# Patient Record
Sex: Female | Born: 1970 | Race: White | Hispanic: No | Marital: Married | Smoking: Never smoker
Health system: Southern US, Community
[De-identification: ages and names within clinical notes are randomized; demographics above are authoritative.]

## PROBLEM LIST (undated history)

## (undated) DIAGNOSIS — I340 Nonrheumatic mitral (valve) insufficiency: Secondary | ICD-10-CM

## (undated) DIAGNOSIS — D649 Anemia, unspecified: Secondary | ICD-10-CM

## (undated) DIAGNOSIS — T8859XA Other complications of anesthesia, initial encounter: Secondary | ICD-10-CM

## (undated) DIAGNOSIS — E739 Lactose intolerance, unspecified: Secondary | ICD-10-CM

## (undated) DIAGNOSIS — R002 Palpitations: Secondary | ICD-10-CM

## (undated) DIAGNOSIS — E559 Vitamin D deficiency, unspecified: Secondary | ICD-10-CM

## (undated) DIAGNOSIS — I6529 Occlusion and stenosis of unspecified carotid artery: Secondary | ICD-10-CM

## (undated) DIAGNOSIS — F419 Anxiety disorder, unspecified: Secondary | ICD-10-CM

## (undated) DIAGNOSIS — K829 Disease of gallbladder, unspecified: Secondary | ICD-10-CM

## (undated) DIAGNOSIS — E7800 Pure hypercholesterolemia, unspecified: Secondary | ICD-10-CM

## (undated) DIAGNOSIS — K589 Irritable bowel syndrome without diarrhea: Secondary | ICD-10-CM

## (undated) DIAGNOSIS — I493 Ventricular premature depolarization: Secondary | ICD-10-CM

## (undated) DIAGNOSIS — F988 Other specified behavioral and emotional disorders with onset usually occurring in childhood and adolescence: Secondary | ICD-10-CM

## (undated) DIAGNOSIS — M549 Dorsalgia, unspecified: Secondary | ICD-10-CM

## (undated) DIAGNOSIS — F32A Depression, unspecified: Secondary | ICD-10-CM

## (undated) DIAGNOSIS — M1990 Unspecified osteoarthritis, unspecified site: Secondary | ICD-10-CM

## (undated) DIAGNOSIS — N390 Urinary tract infection, site not specified: Secondary | ICD-10-CM

## (undated) DIAGNOSIS — I10 Essential (primary) hypertension: Secondary | ICD-10-CM

## (undated) DIAGNOSIS — R12 Heartburn: Secondary | ICD-10-CM

## (undated) HISTORY — DX: Occlusion and stenosis of unspecified carotid artery: I65.29

## (undated) HISTORY — DX: Lactose intolerance, unspecified: E73.9

## (undated) HISTORY — DX: Depression, unspecified: F32.A

## (undated) HISTORY — PX: COLONOSCOPY WITH ESOPHAGOGASTRODUODENOSCOPY (EGD): SHX5779

## (undated) HISTORY — DX: Other specified behavioral and emotional disorders with onset usually occurring in childhood and adolescence: F98.8

## (undated) HISTORY — DX: Heartburn: R12

## (undated) HISTORY — DX: Disease of gallbladder, unspecified: K82.9

## (undated) HISTORY — DX: Vitamin D deficiency, unspecified: E55.9

## (undated) HISTORY — DX: Dorsalgia, unspecified: M54.9

## (undated) HISTORY — DX: Irritable bowel syndrome, unspecified: K58.9

## (undated) HISTORY — DX: Urinary tract infection, site not specified: N39.0

## (undated) HISTORY — DX: Anxiety disorder, unspecified: F41.9

---

## 2004-04-04 ENCOUNTER — Ambulatory Visit: Payer: Self-pay

## 2005-02-01 ENCOUNTER — Ambulatory Visit: Payer: Self-pay

## 2006-04-11 ENCOUNTER — Ambulatory Visit (HOSPITAL_BASED_OUTPATIENT_CLINIC_OR_DEPARTMENT_OTHER): Admission: RE | Admit: 2006-04-11 | Discharge: 2006-04-11 | Payer: Self-pay | Admitting: Orthopaedic Surgery

## 2007-11-06 ENCOUNTER — Ambulatory Visit: Payer: Self-pay | Admitting: Internal Medicine

## 2007-11-27 ENCOUNTER — Ambulatory Visit: Payer: Self-pay | Admitting: Cardiology

## 2008-07-01 ENCOUNTER — Ambulatory Visit: Payer: Self-pay

## 2009-05-13 HISTORY — PX: CHOLECYSTECTOMY: SHX55

## 2009-06-06 ENCOUNTER — Ambulatory Visit: Payer: Self-pay | Admitting: Internal Medicine

## 2009-08-17 ENCOUNTER — Ambulatory Visit: Payer: Self-pay | Admitting: Surgery

## 2009-08-24 ENCOUNTER — Ambulatory Visit: Payer: Self-pay | Admitting: Surgery

## 2009-11-19 ENCOUNTER — Ambulatory Visit: Payer: Self-pay | Admitting: Unknown Physician Specialty

## 2010-09-14 ENCOUNTER — Ambulatory Visit: Payer: Self-pay

## 2010-11-02 ENCOUNTER — Ambulatory Visit: Payer: Self-pay | Admitting: Gastroenterology

## 2010-11-06 LAB — PATHOLOGY REPORT

## 2011-06-14 ENCOUNTER — Ambulatory Visit: Payer: Self-pay | Admitting: Gastroenterology

## 2011-09-13 ENCOUNTER — Ambulatory Visit: Payer: Self-pay | Admitting: Orthopedic Surgery

## 2011-12-09 ENCOUNTER — Ambulatory Visit: Payer: Self-pay | Admitting: Orthopedic Surgery

## 2012-09-23 ENCOUNTER — Other Ambulatory Visit: Payer: Self-pay | Admitting: Gastroenterology

## 2012-10-25 ENCOUNTER — Emergency Department: Payer: Self-pay | Admitting: Emergency Medicine

## 2012-10-25 LAB — COMPREHENSIVE METABOLIC PANEL
Albumin: 3.7 g/dL (ref 3.4–5.0)
Anion Gap: 4 — ABNORMAL LOW (ref 7–16)
BUN: 12 mg/dL (ref 7–18)
Bilirubin,Total: 0.3 mg/dL (ref 0.2–1.0)
Creatinine: 0.87 mg/dL (ref 0.60–1.30)
EGFR (African American): >60
Potassium: 4 mmol/L (ref 3.5–5.1)
Sodium: 140 mmol/L (ref 136–145)
Total Protein: 7.5 g/dL (ref 6.4–8.2)

## 2012-10-25 LAB — CK TOTAL AND CKMB (NOT AT ARMC): CK-MB: <0.5 ng/mL — ABNORMAL LOW (ref 0.5–3.6)

## 2012-10-25 LAB — CBC
HGB: 13.7 g/dL (ref 12.0–16.0)
MCH: 31.9 pg (ref 26.0–34.0)
Platelet: 294 10*3/uL (ref 150–440)
RBC: 4.3 10*6/uL (ref 3.80–5.20)
RDW: 12.9 % (ref 11.5–14.5)

## 2012-10-25 LAB — TROPONIN I: Troponin-I: <0.02 ng/mL

## 2012-10-25 LAB — PRO B NATRIURETIC PEPTIDE: B-Type Natriuretic Peptide: 90 pg/mL (ref 0–125)

## 2012-10-26 LAB — DRUG SCREEN, URINE
Barbiturates, Ur Screen: NEGATIVE (ref ?–200)
Cannabinoid 50 Ng, Ur ~~LOC~~: NEGATIVE (ref ?–50)
Cocaine Metabolite,Ur ~~LOC~~: NEGATIVE (ref ?–300)
MDMA (Ecstasy)Ur Screen: NEGATIVE (ref ?–500)

## 2012-10-26 LAB — TROPONIN I: Troponin-I: <0.02 ng/mL

## 2012-11-19 ENCOUNTER — Ambulatory Visit: Payer: Self-pay | Admitting: Internal Medicine

## 2012-12-15 ENCOUNTER — Emergency Department: Payer: Self-pay | Admitting: Emergency Medicine

## 2012-12-15 LAB — BASIC METABOLIC PANEL
Anion Gap: 7 (ref 7–16)
BUN: 11 mg/dL (ref 7–18)
Calcium, Total: 8.6 mg/dL (ref 8.5–10.1)
Co2: 27 mmol/L (ref 21–32)
Creatinine: 0.72 mg/dL (ref 0.60–1.30)
EGFR (African American): >60
Glucose: 96 mg/dL (ref 65–99)
Potassium: 3.5 mmol/L (ref 3.5–5.1)

## 2012-12-15 LAB — LIPASE, BLOOD: Lipase: 139 U/L (ref 73–393)

## 2012-12-15 LAB — CBC
HGB: 13.6 g/dL (ref 12.0–16.0)
MCHC: 35.2 g/dL (ref 32.0–36.0)
Platelet: 274 10*3/uL (ref 150–440)
RBC: 4.15 10*6/uL (ref 3.80–5.20)

## 2012-12-15 LAB — CK: CK, Total: 83 U/L (ref 21–215)

## 2012-12-18 ENCOUNTER — Ambulatory Visit: Payer: Self-pay | Admitting: Gastroenterology

## 2013-02-05 ENCOUNTER — Ambulatory Visit: Payer: Self-pay | Admitting: Gastroenterology

## 2013-02-09 LAB — PATHOLOGY REPORT

## 2013-02-25 ENCOUNTER — Ambulatory Visit: Payer: Self-pay

## 2013-03-10 ENCOUNTER — Ambulatory Visit: Payer: Self-pay | Admitting: Internal Medicine

## 2013-04-16 ENCOUNTER — Ambulatory Visit: Payer: Self-pay | Admitting: General Practice

## 2013-04-23 ENCOUNTER — Ambulatory Visit: Payer: Self-pay | Admitting: Orthopedic Surgery

## 2013-05-13 ENCOUNTER — Ambulatory Visit: Payer: Self-pay | Admitting: General Practice

## 2013-10-11 HISTORY — PX: LAPAROSCOPIC GASTRIC SLEEVE RESECTION WITH HIATAL HERNIA REPAIR: SHX6512

## 2013-10-28 ENCOUNTER — Ambulatory Visit: Payer: Self-pay | Admitting: Bariatrics

## 2013-11-09 ENCOUNTER — Inpatient Hospital Stay: Payer: Self-pay | Admitting: Bariatrics

## 2013-11-09 LAB — CREATININE, SERUM
Creatinine: 0.82 mg/dL (ref 0.60–1.30)
EGFR (African American): >60
EGFR (Non-African Amer.): >60

## 2013-11-10 LAB — BASIC METABOLIC PANEL
ANION GAP: 5 — AB (ref 7–16)
BUN: 4 mg/dL — AB (ref 7–18)
CHLORIDE: 103 mmol/L (ref 98–107)
CREATININE: 0.77 mg/dL (ref 0.60–1.30)
Calcium, Total: 8.8 mg/dL (ref 8.5–10.1)
Co2: 29 mmol/L (ref 21–32)
GLUCOSE: 150 mg/dL — AB (ref 65–99)
OSMOLALITY: 274 (ref 275–301)
Potassium: 4.3 mmol/L (ref 3.5–5.1)
SODIUM: 137 mmol/L (ref 136–145)

## 2013-11-10 LAB — CBC WITH DIFFERENTIAL/PLATELET
Basophil #: 0 10*3/uL (ref 0.0–0.1)
Basophil %: 0.1 %
EOS ABS: 0 10*3/uL (ref 0.0–0.7)
Eosinophil %: 0 %
HCT: 38.4 % (ref 35.0–47.0)
HGB: 12.7 g/dL (ref 12.0–16.0)
LYMPHS PCT: 10.2 %
Lymphocyte #: 1.2 10*3/uL (ref 1.0–3.6)
MCH: 30.1 pg (ref 26.0–34.0)
MCHC: 33 g/dL (ref 32.0–36.0)
MCV: 91 fL (ref 80–100)
MONOS PCT: 5.6 %
Monocyte #: 0.7 x10 3/mm (ref 0.2–0.9)
NEUTROS ABS: 10.1 10*3/uL — AB (ref 1.4–6.5)
NEUTROS PCT: 84.1 %
PLATELETS: 362 10*3/uL (ref 150–440)
RBC: 4.21 10*6/uL (ref 3.80–5.20)
RDW: 13.7 % (ref 11.5–14.5)
WBC: 12 10*3/uL — AB (ref 3.6–11.0)

## 2013-11-10 LAB — PHOSPHORUS: Phosphorus: 3.3 mg/dL (ref 2.5–4.9)

## 2013-11-10 LAB — MAGNESIUM: Magnesium: 2.3 mg/dL

## 2013-11-10 LAB — ALBUMIN: Albumin: 3.8 g/dL (ref 3.4–5.0)

## 2013-11-11 LAB — PATHOLOGY REPORT

## 2013-11-23 ENCOUNTER — Ambulatory Visit: Payer: Self-pay | Admitting: Bariatrics

## 2013-12-11 ENCOUNTER — Ambulatory Visit: Payer: Self-pay | Admitting: Bariatrics

## 2014-09-03 NOTE — Op Note (Signed)
PATIENT NAME:  Cheryl Murillo, MCKILLOP MR#:  [id number] DATE OF BIRTH:  08-01-1970  DATE OF PROCEDURE:  11/09/2013  PROCEDURES PERFORMED: Laparoscopic sleeve gastrectomy with repair of hiatal hernia.   SURGEON:  Tyrone Apple. Alva Garnet, M.D.   ASSISTANT:  Anabel Halon, PA.   PREOPERATIVE DIAGNOSIS: Long-standing obesity associated with comorbidities of hypertension and prediabetes.    SECONDARY DIAGOSIS:  Chronic gastroesophageal reflux disease.  POSTOPERATIVE DIAGNOSIS:  Long-standing obesity associated with comorbidities of hypertension and prediabetes.  Chronic gastroesophageal reflux disease. Presence of a small hiatal hernia, not defined by preoperative upper GI series.   PROCEDURE NOTE: The patient was brought to the operating room and placed in the supine position. General anesthesia was obtained with orotracheal intubation. TED hose and Thromboguards were applied, a foot board was applied at the end of the operative bed. The lower chest and abdomen were then sterilely prepped and draped.   The patient had a 5-mm Optiview trocar introduced under direct visualization in the left upper quadrant of the abdomen. Pneumoperitoneum was obtained with carbon dioxide. The patient then had 3 additional trocars introduced across the upper abdomen and a Nathanson liver retractor introduced through the subxiphoid defect. Upon elevation of the left lobe of the liver the  patient was noted to have a moderately significant indentation in the region of the hiatus. Fat pad was released from the anterior stomach and undersurface of the left hemidiaphragm. It was then followed by release of phrenoesophageal ligaments and attachments of the upper fundus from the undersurface of the left hemidiaphragm. In so doing the GE junction was noted to be lying just within the hiatus suggestive of a small sliding hiatal hernia.   It was decided to proceed with repair of this given the patient's history of reflux disease managed by  way of PPI therapy and increased potential for reflux following creation of the sleeve effect. The patient had division of the gastrohepatic ligament, followed by division of the peritoneum across the anterior hiatus. This was then followed by division of the peritoneum along the right crural margin. Blunt dissection was used to reduce herniated lesser sac fatty tissue and expose the underlying posterior vagal nerve and separate it from the underlying aorta.   The patient then had circumferential dissection of the lower esophagus within the lower mediastinum, freeing the esophagus and associated anterior and posterior vagal nerves away from the aorta, the pleural surfaces and the anterior aspect from the pericardium. Circumferential dissection of the esophagus extended into the lower mediastinum over a distance of approximately 6 cm. This resulted in delivery of 1.5 cm of the esophagus lying comfortably in the abdominal cavity.   At this point 2 interrupted 0-Surgidac sutures were used to approximate the crural musculature posteriorly. This was then followed by identification of the pylorus and beginning approximately 4 cm proximal to this there was division of arcade vessels along the greater curvature of the stomach. This was extended superiorly over a distance of approximately 8 cm. Posterior attachments of the stomach to the pancreas then taken down by use of the Harmonic scalpel.   The patient then had initiation of creation of a medially-based gastric tube effect. The use of GI staplers was done sequentially, the first being a green load of staples placed in a relative transverse direction avoiding narrowing in the region of the incisura. A second gold load of staple was also placed in a transverse direction. This was done with use of Seamguard staple line buttress system. A 36 [place]  ViSiGi device was then directed into the area of the antrum and used as a guide for creation of a more vertical line of  staples along the lesser curvature of the stomach. After these firings were done the ViSiGi device was gradually withdrawn to try to effect a smooth, tubular effect with consistency of the working lumen.   Eventually the staple line was brought out just lateral to the angle of His. The lateral stomach was then freed from the residual attachments to the gastrocolic and gastrosplenic ligaments. The ViSiGi device was then used for insufflation of the stomach which was sealed distally and a water bath was performed.  No air leak was identified. The ViSiGi device was withdrawn at this time.   The dog-ear of stomach in the region of the angle of His was then fixed to the left crural musculature with an additional suture placed inferior to this. This was done in an effort to fixate the stomach in the abdominal cavity and prevent remigration to the lower mediastinum. The lateral stomach was then retrieved through the 12-mm trocar site in the right upper abdomen. The fascia and peritoneum of defect was closed with 0-Vicryl suture passed by a needle system under direct visualization. The pneumoperitoneum was relieved. The trocar wounds were treated with 0.25% Marcaine with epinephrine and closed with 4-0 Monocryl in the dermis followed by Dermabond. The patient was allowed to recover having tolerated the procedure well.    ____________________________ Tyrone AppleMichael A. Alva Garnetyner, MD mat:lt D: 11/09/2013 10:47:13 ET T: 11/09/2013 11:01:06 ET JOB#: [id number]  cc: Casimiro NeedleMichael A. Alva Garnetyner, MD, <Dictator> Everette RankMICHAEL A TYNER MD ELECTRONICALLY SIGNED 11/10/2013 7:59

## 2014-11-01 ENCOUNTER — Other Ambulatory Visit: Payer: Self-pay | Admitting: Orthopedic Surgery

## 2014-11-01 DIAGNOSIS — M5412 Radiculopathy, cervical region: Secondary | ICD-10-CM

## 2014-11-03 ENCOUNTER — Ambulatory Visit
Admission: RE | Admit: 2014-11-03 | Discharge: 2014-11-03 | Disposition: A | Discharge status: Home / Self Care | Payer: BLUE CROSS/BLUE SHIELD | Source: Ambulatory Visit | Attending: Orthopedic Surgery | Admitting: Orthopedic Surgery

## 2014-11-03 DIAGNOSIS — M5382 Other specified dorsopathies, cervical region: Secondary | ICD-10-CM | POA: Diagnosis not present

## 2014-11-03 DIAGNOSIS — M4802 Spinal stenosis, cervical region: Secondary | ICD-10-CM | POA: Diagnosis not present

## 2014-11-03 DIAGNOSIS — M5412 Radiculopathy, cervical region: Secondary | ICD-10-CM

## 2014-11-03 DIAGNOSIS — M542 Cervicalgia: Secondary | ICD-10-CM | POA: Diagnosis present

## 2014-11-15 ENCOUNTER — Other Ambulatory Visit: Payer: Self-pay | Admitting: Bariatrics

## 2014-11-15 DIAGNOSIS — K219 Gastro-esophageal reflux disease without esophagitis: Secondary | ICD-10-CM

## 2014-11-24 ENCOUNTER — Ambulatory Visit
Admission: RE | Admit: 2014-11-24 | Discharge: 2014-11-24 | Disposition: A | Discharge status: Home / Self Care | Payer: BLUE CROSS/BLUE SHIELD | Source: Ambulatory Visit | Attending: Bariatrics | Admitting: Bariatrics

## 2014-11-24 DIAGNOSIS — K219 Gastro-esophageal reflux disease without esophagitis: Secondary | ICD-10-CM | POA: Insufficient documentation

## 2014-11-24 DIAGNOSIS — K2970 Gastritis, unspecified, without bleeding: Secondary | ICD-10-CM | POA: Insufficient documentation

## 2015-10-26 ENCOUNTER — Other Ambulatory Visit: Payer: Self-pay | Admitting: Internal Medicine

## 2015-10-26 DIAGNOSIS — Z1231 Encounter for screening mammogram for malignant neoplasm of breast: Secondary | ICD-10-CM

## 2015-11-16 ENCOUNTER — Ambulatory Visit: Payer: BLUE CROSS/BLUE SHIELD | Attending: Internal Medicine

## 2015-12-14 ENCOUNTER — Ambulatory Visit
Admission: RE | Admit: 2015-12-14 | Discharge: 2015-12-14 | Disposition: A | Discharge status: Home / Self Care | Payer: Managed Care, Other (non HMO) | Source: Ambulatory Visit | Attending: Internal Medicine | Admitting: Internal Medicine

## 2015-12-14 DIAGNOSIS — Z1231 Encounter for screening mammogram for malignant neoplasm of breast: Secondary | ICD-10-CM | POA: Insufficient documentation

## 2015-12-15 ENCOUNTER — Other Ambulatory Visit: Payer: Self-pay | Admitting: Internal Medicine

## 2015-12-15 DIAGNOSIS — N6310 Unspecified lump in the right breast, unspecified quadrant: Secondary | ICD-10-CM

## 2016-01-24 ENCOUNTER — Ambulatory Visit
Admission: RE | Admit: 2016-01-24 | Discharge: 2016-01-24 | Disposition: A | Discharge status: Home / Self Care | Payer: Managed Care, Other (non HMO) | Source: Ambulatory Visit | Attending: Internal Medicine | Admitting: Internal Medicine

## 2016-01-24 DIAGNOSIS — N63 Unspecified lump in breast: Secondary | ICD-10-CM | POA: Diagnosis present

## 2016-01-24 DIAGNOSIS — N6310 Unspecified lump in the right breast, unspecified quadrant: Secondary | ICD-10-CM

## 2016-01-29 ENCOUNTER — Other Ambulatory Visit: Payer: BLUE CROSS/BLUE SHIELD

## 2016-01-29 ENCOUNTER — Ambulatory Visit: Payer: BLUE CROSS/BLUE SHIELD

## 2016-03-28 ENCOUNTER — Other Ambulatory Visit (INDEPENDENT_AMBULATORY_CARE_PROVIDER_SITE_OTHER): Payer: Self-pay | Admitting: Vascular Surgery

## 2016-03-28 DIAGNOSIS — I6522 Occlusion and stenosis of left carotid artery: Secondary | ICD-10-CM

## 2016-03-29 ENCOUNTER — Encounter (INDEPENDENT_AMBULATORY_CARE_PROVIDER_SITE_OTHER): Payer: Managed Care, Other (non HMO)

## 2016-03-29 ENCOUNTER — Ambulatory Visit (INDEPENDENT_AMBULATORY_CARE_PROVIDER_SITE_OTHER): Payer: Managed Care, Other (non HMO) | Admitting: Vascular Surgery

## 2016-05-24 ENCOUNTER — Ambulatory Visit (INDEPENDENT_AMBULATORY_CARE_PROVIDER_SITE_OTHER): Payer: Managed Care, Other (non HMO)

## 2016-05-24 ENCOUNTER — Ambulatory Visit (INDEPENDENT_AMBULATORY_CARE_PROVIDER_SITE_OTHER): Payer: Managed Care, Other (non HMO) | Admitting: Vascular Surgery

## 2016-05-24 ENCOUNTER — Encounter (INDEPENDENT_AMBULATORY_CARE_PROVIDER_SITE_OTHER): Payer: Self-pay | Admitting: Vascular Surgery

## 2016-05-24 VITALS — BP 121/80 | HR 74 | Resp 16 | Ht 66.5 in | Wt 166.0 lb

## 2016-05-24 DIAGNOSIS — I83812 Varicose veins of left lower extremities with pain: Secondary | ICD-10-CM | POA: Insufficient documentation

## 2016-05-24 DIAGNOSIS — I6523 Occlusion and stenosis of bilateral carotid arteries: Secondary | ICD-10-CM

## 2016-05-24 DIAGNOSIS — I6529 Occlusion and stenosis of unspecified carotid artery: Secondary | ICD-10-CM | POA: Insufficient documentation

## 2016-05-24 DIAGNOSIS — I6522 Occlusion and stenosis of left carotid artery: Secondary | ICD-10-CM | POA: Diagnosis not present

## 2016-05-24 NOTE — Assessment & Plan Note (Signed)
We will obtain a venous reflux study in the near future at her convenience. She has had good results on the right leg, and now needs to have left leg assessed. She will continue to wear her compression stockings and elevate her legs.

## 2016-05-24 NOTE — Progress Notes (Signed)
MRN : [id number]  Cheryl Murillo is a 46 y.o. (05/02/1971) female who presents with chief complaint of  Chief Complaint  Patient presents with  . Carotid    Ultrasound follow up  .  History of Present Illness: Patient returns in follow-up of her carotid disease as well as her venous disease. She has had no focal neurologic symptoms. Specifically, the patient denies amaurosis fugax, speech or swallowing difficulties, or arm or leg weakness or numbness. Her carotid duplex today reveals stable mild carotid artery disease and a 1-39% range bilaterally with some tortuosity worse on the left than right. She is also having more problems with pain and swelling in her left leg. She is already undergone successful laser ablation on the right leg with improvement in her symptoms. It has been well over a year's she has had assessment of her left leg.  Current Outpatient Prescriptions  Medication Sig Dispense Refill  . Melatonin 10 MG TABS Take by mouth.    . ziprasidone (GEODON) 20 MG capsule Take by mouth.     No current facility-administered medications for this visit.     Past Medical History:  Diagnosis Date  . Carotid artery occlusion     No past surgical history on file.  Social History Social History  Substance Use Topics  . Smoking status: Never Smoker  . Smokeless tobacco: Never Used  . Alcohol use Yes    Family History No bleeding or clotting disorders  Allergies  Allergen Reactions  . Benzonatate Diarrhea, Nausea Only and Nausea And Vomiting  . Celecoxib Diarrhea  . Etodolac Other (See Comments)    Stomach irritation  . Oxycodone-Acetaminophen Itching  . Sulfa Antibiotics Itching     REVIEW OF SYSTEMS (Negative unless checked)  Constitutional: [] Weight loss  [] Fever  [] Chills Cardiac: [] Chest pain   [] Chest pressure   [] Palpitations   [] Shortness of breath when laying flat   [] Shortness of breath at rest   [] Shortness of breath with exertion. Vascular:   [] Pain in legs with walking   [x] Pain in legs at rest   [] Pain in legs when laying flat   [] Claudication   [] Pain in feet when walking  [] Pain in feet at rest  [] Pain in feet when laying flat   [] History of DVT   [] Phlebitis   [x] Swelling in legs   [] Varicose veins   [] Non-healing ulcers Pulmonary:   [] Uses home oxygen   [] Productive cough   [] Hemoptysis   [] Wheeze  [] COPD   [] Asthma Neurologic:  [] Dizziness  [] Blackouts   [] Seizures   [] History of stroke   [] History of TIA  [] Aphasia   [] Temporary blindness   [] Dysphagia   [] Weakness or numbness in arms   [] Weakness or numbness in legs Musculoskeletal:  [] Arthritis   [] Joint swelling   [] Joint pain   [] Low back pain Hematologic:  [] Easy bruising  [] Easy bleeding   [] Hypercoagulable state   [] Anemic  [] Hepatitis Gastrointestinal:  [] Blood in stool   [] Vomiting blood  [] Gastroesophageal reflux/heartburn   [] Difficulty swallowing. Genitourinary:  [] Chronic kidney disease   [] Difficult urination  [] Frequent urination  [] Burning with urination   [] Blood in urine Skin:  [] Rashes   [] Ulcers   [] Wounds Psychological:  [] History of anxiety   []  History of major depression.  Physical Examination  Vitals:   05/24/16 0928 05/24/16 0929  BP: 118/75 121/80  Pulse: 74   Resp: 16   Weight: 166 lb (75.3 kg)   Height: 5' 6.5" (1.689 m)  Body mass index is 26.39 kg/m. Gen:  WD/WN, NAD Head: [place]/AT, No temporalis wasting. Ear/Nose/Throat: Hearing grossly intact, nares w/o erythema or drainage, trachea midline Eyes: Conjunctiva clear. Sclera non-icteric Neck: Supple.  No bruit or JVD.  Pulmonary:  Good air movement, equal and clear to auscultation bilaterally.  Cardiac: RRR, normal S1, S2, no Murmurs, rubs or gallops. Vascular:  Vessel Right Left  Radial Palpable Palpable                                   Gastrointestinal: soft, non-tender/non-distended. No guarding/reflex.  Musculoskeletal: M/S 5/5 throughout.  No deformity or atrophy. 1+ LLE  edema. Neurologic: CN 2-12 intact. Sensation grossly intact in extremities.  Symmetrical.  Speech is fluent. Motor exam as listed above. Psychiatric: Judgment intact, Mood & affect appropriate for pt's clinical situation. Dermatologic: No rashes or ulcers noted.  No cellulitis or open wounds. Lymph : No Cervical, Axillary, or Inguinal lymphadenopathy.     CBC Lab Results  Component Value Date   WBC 12.0 (H) 11/10/2013   HGB 12.7 11/10/2013   HCT 38.4 11/10/2013   MCV 91 11/10/2013   PLT 362 11/10/2013    BMET    Component Value Date/Time   NA 137 11/10/2013 0516   K 4.3 11/10/2013 0516   CL 103 11/10/2013 0516   CO2 29 11/10/2013 0516   GLUCOSE 150 (H) 11/10/2013 0516   BUN 4 (L) 11/10/2013 0516   CREATININE 0.77 11/10/2013 0516   CALCIUM 8.8 11/10/2013 0516   GFRNONAA >60 11/10/2013 0516   GFRAA >60 11/10/2013 0516   CrCl cannot be calculated (Patient's most recent lab result is older than the maximum 21 days allowed.).  COAG No results found for: INR, PROTIME  Radiology No results found.    Assessment/Plan Carotid stenosis The patient has stable, mild carotid artery disease bilaterally. We will continue to check this on an annual basis and she will call our office with any problems in the interim.  Varicose veins of leg with pain, left We will obtain a venous reflux study in the near future at her convenience. She has had good results on the right leg, and now needs to have left leg assessed. She will continue to wear her compression stockings and elevate her legs.    Festus BarrenJason Dew, MD  05/24/2016 11:25 AM    This note was created with Dragon medical transcription system.  Any errors from dictation are purely unintentional

## 2016-05-24 NOTE — Assessment & Plan Note (Signed)
The patient has stable, mild carotid artery disease bilaterally. We will continue to check this on an annual basis and she will call our office with any problems in the interim.

## 2016-07-05 ENCOUNTER — Ambulatory Visit (INDEPENDENT_AMBULATORY_CARE_PROVIDER_SITE_OTHER): Payer: Managed Care, Other (non HMO) | Admitting: Vascular Surgery

## 2016-07-05 ENCOUNTER — Encounter (INDEPENDENT_AMBULATORY_CARE_PROVIDER_SITE_OTHER): Payer: Managed Care, Other (non HMO)

## 2016-07-17 ENCOUNTER — Other Ambulatory Visit: Payer: Self-pay | Admitting: Internal Medicine

## 2016-07-17 DIAGNOSIS — R1084 Generalized abdominal pain: Secondary | ICD-10-CM

## 2016-07-17 DIAGNOSIS — R197 Diarrhea, unspecified: Secondary | ICD-10-CM

## 2016-07-26 ENCOUNTER — Ambulatory Visit: Payer: BLUE CROSS/BLUE SHIELD

## 2016-08-02 ENCOUNTER — Ambulatory Visit
Admission: RE | Admit: 2016-08-02 | Discharge: 2016-08-02 | Disposition: A | Discharge status: Home / Self Care | Payer: Managed Care, Other (non HMO) | Source: Ambulatory Visit | Attending: Internal Medicine | Admitting: Internal Medicine

## 2016-08-02 DIAGNOSIS — Z9884 Bariatric surgery status: Secondary | ICD-10-CM | POA: Insufficient documentation

## 2016-08-02 DIAGNOSIS — R1084 Generalized abdominal pain: Secondary | ICD-10-CM | POA: Insufficient documentation

## 2016-08-02 DIAGNOSIS — R197 Diarrhea, unspecified: Secondary | ICD-10-CM | POA: Diagnosis present

## 2016-08-02 DIAGNOSIS — Z9049 Acquired absence of other specified parts of digestive tract: Secondary | ICD-10-CM | POA: Insufficient documentation

## 2016-08-02 MED ORDER — IOPAMIDOL (ISOVUE-300) INJECTION 61%
100.0000 mL | Freq: Once | INTRAVENOUS | Status: AC | PRN
  Administered 2016-08-02: 100 mL via INTRAVENOUS

## 2016-09-13 ENCOUNTER — Encounter (INDEPENDENT_AMBULATORY_CARE_PROVIDER_SITE_OTHER): Payer: Managed Care, Other (non HMO)

## 2016-09-13 ENCOUNTER — Ambulatory Visit (INDEPENDENT_AMBULATORY_CARE_PROVIDER_SITE_OTHER): Payer: Managed Care, Other (non HMO) | Admitting: Vascular Surgery

## 2016-11-22 ENCOUNTER — Ambulatory Visit (INDEPENDENT_AMBULATORY_CARE_PROVIDER_SITE_OTHER): Payer: 59

## 2016-11-22 ENCOUNTER — Ambulatory Visit (INDEPENDENT_AMBULATORY_CARE_PROVIDER_SITE_OTHER): Payer: 59 | Admitting: Vascular Surgery

## 2016-11-22 ENCOUNTER — Encounter (INDEPENDENT_AMBULATORY_CARE_PROVIDER_SITE_OTHER): Payer: Self-pay | Admitting: Vascular Surgery

## 2016-11-22 VITALS — BP 103/69 | HR 66 | Resp 17 | Wt 172.0 lb

## 2016-11-22 DIAGNOSIS — I83812 Varicose veins of left lower extremities with pain: Secondary | ICD-10-CM

## 2016-11-22 DIAGNOSIS — I6523 Occlusion and stenosis of bilateral carotid arteries: Secondary | ICD-10-CM

## 2016-11-22 NOTE — Progress Notes (Addendum)
Patient ID: Cheryl Murillo, female   DOB: 02/05/1971, 46 y.o.   MRN: [id number]  Chief Complaint  Patient presents with  . Re-evaluation    ultrasound follow up    HPI Cheryl Murillo is a 46 y.o. female.  Patient returns in follow up of their venous disease.  They have done their best to comply with the prescribed conservative therapies of compression stockings, leg elevation, exercise, and still requires anti-inflammatories for discomfort and has symptoms that are persistent and bothersome on a daily basis, affecting their activities of daily living and normal activities.  The venous reflux study demonstrates left GSV and left popliteal vein reflux.  No DVT or superficial thrombophlebitis was seen.           Current Outpatient Prescriptions  Medication Sig Dispense Refill  . Melatonin 10 MG TABS Take by mouth.    . ziprasidone (GEODON) 20 MG capsule Take by mouth.     No current facility-administered medications for this visit.         Past Medical History:  Diagnosis Date  . Carotid artery occlusion     No past surgical history on file.  Social History     Social History  Substance Use Topics  . Smoking status: Never Smoker  . Smokeless tobacco: Never Used  . Alcohol use Yes    Family History No bleeding or clotting disorders       Allergies  Allergen Reactions  . Benzonatate Diarrhea, Nausea Only and Nausea And Vomiting  . Celecoxib Diarrhea  . Etodolac Other (See Comments)    Stomach irritation  . Oxycodone-Acetaminophen Itching  . Sulfa Antibiotics Itching     REVIEW OF SYSTEMS (Negative unless checked)  Constitutional: [] Weight loss  [] Fever  [] Chills Cardiac: [] Chest pain   [] Chest pressure   [] Palpitations   [] Shortness of breath when laying flat   [] Shortness of breath at rest   [] Shortness of breath with exertion. Vascular:  [] Pain in legs with walking   [x] Pain in legs at rest   [] Pain in legs when laying flat    [] Claudication   [] Pain in feet when walking  [] Pain in feet at rest  [] Pain in feet when laying flat   [] History of DVT   [] Phlebitis   [x] Swelling in legs   [] Varicose veins   [] Non-healing ulcers Pulmonary:   [] Uses home oxygen   [] Productive cough   [] Hemoptysis   [] Wheeze  [] COPD   [] Asthma Neurologic:  [] Dizziness  [] Blackouts   [] Seizures   [] History of stroke   [] History of TIA  [] Aphasia   [] Temporary blindness   [] Dysphagia   [] Weakness or numbness in arms   [] Weakness or numbness in legs Musculoskeletal:  [] Arthritis   [] Joint swelling   [] Joint pain   [] Low back pain Hematologic:  [] Easy bruising  [] Easy bleeding   [] Hypercoagulable state   [] Anemic  [] Hepatitis Gastrointestinal:  [] Blood in stool   [] Vomiting blood  [] Gastroesophageal reflux/heartburn   [] Difficulty swallowing. Genitourinary:  [] Chronic kidney disease   [] Difficult urination  [] Frequent urination  [] Burning with urination   [] Blood in urine Skin:  [] Rashes   [] Ulcers   [] Wounds Psychological:  [] History of anxiety   []  History of major depression.    Physical Exam BP 103/69   Pulse 66   Resp 17   Wt 172 lb (78 kg)   BMI 27.35 kg/m  Gen:  WD/WN, NAD Head: [place]/AT, No temporalis wasting.  Ear/Nose/Throat: Hearing grossly intact, dentition good Eyes:  Sclera non-icteric. Conjunctiva clear Neck: Supple. Trachea midline Pulmonary:  Good air movement, no use of accessory muscles, respirations not labored.  Cardiac: RRR, No JVD Vascular: Varicosities scattered and measuring up to 1-2 mm in the right lower extremity        Varicosities diffuse and measuring up to 2 mm in the left lower extremity Vessel Right Left  Radial Palpable Palpable  Ulnar Palpable Palpable  Brachial Palpable Palpable  Carotid Palpable, without bruit Palpable, without bruit  Aorta Not palpable N/A  Femoral Palpable Palpable  Popliteal Palpable Palpable  PT Palpable Palpable  DP Palpable Palpable   Gastrointestinal: soft,  non-tender/non-distended. No guarding/reflex. No masses, surgical incisions, or scars. Musculoskeletal: M/S 5/5 throughout.   No RLE edema.  Trace LLE edema Neurologic: Sensation grossly intact in extremities.  Symmetrical.  Speech is fluent.  Psychiatric: Judgment intact, Mood & affect appropriate for pt's clinical situation. Dermatologic: No rashes or ulcers noted.  No cellulitis or open wounds. Lymph : No Cervical, Axillary, or Inguinal lymphadenopathy.   Radiology No results found.  Labs No results found for this or any previous visit (from the past 2160 hour(s)).  Assessment/Plan: Carotid stenosis The patient has stable, mild carotid artery disease bilaterally checked earlier this year. We will continue to check this on an annual basis and she will call our office with any problems in the interim.  Varicose veins of leg with pain, left See below   The patient has done their best to comply with conservative therapy of 20-30 mm Hg compression stockings, leg elevation, exercise, and anti-inflammatories as needed for discomfort.  Despite this, they continue to have daily and persistent symptoms from their venous disease.  A venous reflux study demonstrates left GSV and left popliteal vein reflux.  No DVT or superficial thrombophlebitis was seen.  As such, the patient is likely to benefit from endovenous laser ablation of the left GSV.  Risks and benefits of the procedure including bleeding, infection, recanalization, DVT, and need for further therapy for residual varicosities were discussed.  The patient voices their understanding and is agreeable to proceed with left GSV endovenous laser ablation.     Festus BarrenJason Dew 11/22/2016, 2:46 PM

## 2016-11-22 NOTE — Assessment & Plan Note (Signed)
See below

## 2016-11-22 NOTE — Patient Instructions (Signed)
Varicose Vein Surgery, Care After Refer to this sheet in the next few weeks. These instructions provide you with information about caring for yourself after your procedure. Your health care provider may also give you more specific instructions. Your treatment has been planned according to current medical practices, but problems sometimes occur. Call your health care provider if you have any problems or questions after your procedure. What can I expect after the procedure? After your procedure, it is typical to have the following:  Swelling.  Bruising.  Soreness.  Mild skin discoloration.  Slight bleeding at incision sites.  Follow these instructions at home:  Take medicines only as directed by your health care provider.  Wear compression stockings as directed by your health care provider. These stockings help to prevent blood clots and reduce swelling in your legs.  There are many different ways to close and cover an incision, including stitches (sutures), skin glue, and adhesive strips. Follow your health care provider's instructions on: ? Incision care. ? Bandage (dressing) changes and removal. ? Incision closure removal.  Wear loose-fitting clothing.  Get regular daily exercise. Walk or ride a stationary bike daily or as directed by your health care provider.  Ask your health care provider when you can return to work. This may depend on the type of work you do.  Be patient with your recovery. It can take up to 4 weeks to get back to your usual activities. Contact a health care provider if:  You have a fever.  You have drainage, redness, swelling, or pain at an incision site.  You develop a cough. Get help right away if:  You pass out.  You have very bad pain in your leg.  You have leg pain that gets worse when you walk.  You have redness or swelling in your leg that is getting worse.  You have trouble breathing.  You cough up blood. This information is not  intended to replace advice given to you by your health care provider. Make sure you discuss any questions you have with your health care provider. Document Released: 12/31/2013 Document Revised: 10/05/2015 Document Reviewed: 10/06/2013 [employer] Interactive Patient Education  2018 [employer]  

## 2017-01-20 ENCOUNTER — Other Ambulatory Visit: Payer: Self-pay | Admitting: Internal Medicine

## 2017-01-20 DIAGNOSIS — Z1231 Encounter for screening mammogram for malignant neoplasm of breast: Secondary | ICD-10-CM

## 2017-01-31 ENCOUNTER — Ambulatory Visit
Admission: RE | Admit: 2017-01-31 | Discharge: 2017-01-31 | Disposition: A | Discharge status: Home / Self Care | Payer: Managed Care, Other (non HMO) | Source: Ambulatory Visit | Attending: Internal Medicine | Admitting: Internal Medicine

## 2017-01-31 DIAGNOSIS — Z1231 Encounter for screening mammogram for malignant neoplasm of breast: Secondary | ICD-10-CM | POA: Insufficient documentation

## 2017-03-13 ENCOUNTER — Other Ambulatory Visit (HOSPITAL_COMMUNITY): Payer: Self-pay | Admitting: Neurology

## 2017-03-13 ENCOUNTER — Other Ambulatory Visit: Payer: Self-pay | Admitting: Neurology

## 2017-03-13 DIAGNOSIS — R4189 Other symptoms and signs involving cognitive functions and awareness: Secondary | ICD-10-CM

## 2017-03-28 ENCOUNTER — Ambulatory Visit (HOSPITAL_COMMUNITY)
Admission: RE | Admit: 2017-03-28 | Discharge: 2017-03-28 | Disposition: A | Discharge status: Home / Self Care | Payer: 59 | Source: Ambulatory Visit | Attending: Neurology | Admitting: Neurology

## 2017-03-28 DIAGNOSIS — R4189 Other symptoms and signs involving cognitive functions and awareness: Secondary | ICD-10-CM | POA: Diagnosis present

## 2017-05-23 ENCOUNTER — Encounter (INDEPENDENT_AMBULATORY_CARE_PROVIDER_SITE_OTHER): Payer: Managed Care, Other (non HMO)

## 2017-05-23 ENCOUNTER — Ambulatory Visit (INDEPENDENT_AMBULATORY_CARE_PROVIDER_SITE_OTHER): Payer: Managed Care, Other (non HMO) | Admitting: Vascular Surgery

## 2017-06-10 ENCOUNTER — Other Ambulatory Visit: Payer: Self-pay | Admitting: Neurosurgery

## 2017-06-10 DIAGNOSIS — M5416 Radiculopathy, lumbar region: Secondary | ICD-10-CM

## 2017-06-16 ENCOUNTER — Ambulatory Visit: Payer: 59

## 2017-06-23 ENCOUNTER — Ambulatory Visit
Admission: RE | Admit: 2017-06-23 | Discharge: 2017-06-23 | Disposition: A | Discharge status: Home / Self Care | Payer: 59 | Source: Ambulatory Visit | Attending: Neurosurgery | Admitting: Neurosurgery

## 2017-06-23 DIAGNOSIS — M5416 Radiculopathy, lumbar region: Secondary | ICD-10-CM

## 2017-06-23 DIAGNOSIS — M48061 Spinal stenosis, lumbar region without neurogenic claudication: Secondary | ICD-10-CM | POA: Insufficient documentation

## 2017-06-23 DIAGNOSIS — M5137 Other intervertebral disc degeneration, lumbosacral region: Secondary | ICD-10-CM | POA: Diagnosis not present

## 2017-06-23 DIAGNOSIS — M5136 Other intervertebral disc degeneration, lumbar region: Secondary | ICD-10-CM | POA: Insufficient documentation

## 2017-08-01 ENCOUNTER — Ambulatory Visit (INDEPENDENT_AMBULATORY_CARE_PROVIDER_SITE_OTHER): Payer: 59 | Admitting: Vascular Surgery

## 2017-08-01 ENCOUNTER — Encounter (INDEPENDENT_AMBULATORY_CARE_PROVIDER_SITE_OTHER): Payer: 59

## 2017-09-12 ENCOUNTER — Encounter (INDEPENDENT_AMBULATORY_CARE_PROVIDER_SITE_OTHER): Payer: 59

## 2017-09-12 ENCOUNTER — Ambulatory Visit (INDEPENDENT_AMBULATORY_CARE_PROVIDER_SITE_OTHER): Payer: 59 | Admitting: Vascular Surgery

## 2017-11-07 ENCOUNTER — Ambulatory Visit (INDEPENDENT_AMBULATORY_CARE_PROVIDER_SITE_OTHER): Payer: 59 | Admitting: Vascular Surgery

## 2017-11-07 ENCOUNTER — Encounter (INDEPENDENT_AMBULATORY_CARE_PROVIDER_SITE_OTHER): Payer: 59

## 2018-01-27 ENCOUNTER — Encounter (INDEPENDENT_AMBULATORY_CARE_PROVIDER_SITE_OTHER): Payer: 59

## 2018-01-27 ENCOUNTER — Ambulatory Visit (INDEPENDENT_AMBULATORY_CARE_PROVIDER_SITE_OTHER): Payer: 59 | Admitting: Vascular Surgery

## 2018-02-11 ENCOUNTER — Other Ambulatory Visit (INDEPENDENT_AMBULATORY_CARE_PROVIDER_SITE_OTHER): Payer: Self-pay | Admitting: Vascular Surgery

## 2018-02-11 DIAGNOSIS — I6523 Occlusion and stenosis of bilateral carotid arteries: Secondary | ICD-10-CM

## 2018-02-13 ENCOUNTER — Encounter (INDEPENDENT_AMBULATORY_CARE_PROVIDER_SITE_OTHER): Payer: 59

## 2018-02-13 ENCOUNTER — Ambulatory Visit (INDEPENDENT_AMBULATORY_CARE_PROVIDER_SITE_OTHER): Payer: 59 | Admitting: Vascular Surgery

## 2018-03-13 ENCOUNTER — Encounter (INDEPENDENT_AMBULATORY_CARE_PROVIDER_SITE_OTHER): Payer: 59

## 2018-03-13 ENCOUNTER — Ambulatory Visit (INDEPENDENT_AMBULATORY_CARE_PROVIDER_SITE_OTHER): Payer: 59 | Admitting: Vascular Surgery

## 2018-04-24 ENCOUNTER — Encounter (INDEPENDENT_AMBULATORY_CARE_PROVIDER_SITE_OTHER): Payer: Self-pay

## 2018-04-24 ENCOUNTER — Ambulatory Visit (INDEPENDENT_AMBULATORY_CARE_PROVIDER_SITE_OTHER): Payer: Self-pay | Admitting: Vascular Surgery

## 2018-05-29 ENCOUNTER — Ambulatory Visit (INDEPENDENT_AMBULATORY_CARE_PROVIDER_SITE_OTHER): Payer: Managed Care, Other (non HMO) | Admitting: Vascular Surgery

## 2018-05-29 ENCOUNTER — Encounter (INDEPENDENT_AMBULATORY_CARE_PROVIDER_SITE_OTHER): Payer: Self-pay | Admitting: Vascular Surgery

## 2018-05-29 ENCOUNTER — Ambulatory Visit (INDEPENDENT_AMBULATORY_CARE_PROVIDER_SITE_OTHER): Payer: Managed Care, Other (non HMO)

## 2018-05-29 VITALS — BP 130/85 | HR 63 | Resp 16 | Ht 66.5 in | Wt 186.6 lb

## 2018-05-29 DIAGNOSIS — I6523 Occlusion and stenosis of bilateral carotid arteries: Secondary | ICD-10-CM

## 2018-05-29 DIAGNOSIS — I83812 Varicose veins of left lower extremities with pain: Secondary | ICD-10-CM

## 2018-05-29 NOTE — Progress Notes (Signed)
MRN : [id number]  Cheryl Murillo is a 48 y.o. (January 19, 1971) female who presents with chief complaint of  Chief Complaint  Patient presents with  . Follow-up  .  History of Present Illness: Patient returns in follow-up of her carotid disease.  She is doing well without focal neurologic symptoms.  She denies stroke or TIA type symptoms.  She is still having some venous issues in her left leg.  Her right leg is doing much better after treatment.  Her carotid duplex today shows no hemodynamically significant stenosis in either carotid artery.  Previously, she had been found to have some mild carotid disease on her studies.  Current Outpatient Medications  Medication Sig Dispense Refill  . ALPRAZolam (NIRAVAM) 1 MG dissolvable tablet alprazolam 1 mg disintegrating tablet  TAKE 1 TABLET BY MOUTH TWICE A DAY AS NEEDED ANXIETY    . Melatonin 10 MG TABS Take by mouth.     . ziprasidone (GEODON) 20 MG capsule Take by mouth.     No current facility-administered medications for this visit.     Past Medical History:  Diagnosis Date  . Carotid artery occlusion      Social History  Substance Use Topics  . Smoking status: Never Smoker  . Smokeless tobacco: Never Used  . Alcohol use Yes    Family History No bleeding or clotting disorders       Allergies  Allergen Reactions  . Benzonatate Diarrhea, Nausea Only and Nausea And Vomiting  . Celecoxib Diarrhea  . Etodolac Other (See Comments)    Stomach irritation  . Oxycodone-Acetaminophen Itching  . Sulfa Antibiotics Itching     REVIEW OF SYSTEMS(Negative unless checked)  Constitutional: [] ?Weight loss[] ?Fever[] ?Chills Cardiac:[] ?Chest pain[] ?Chest pressure[] ?Palpitations [] ?Shortness of breath when laying flat [] ?Shortness of breath at rest [] ?Shortness of breath with exertion. Vascular: [] ?Pain in legs with walking[x] ?Pain in legsat rest[] ?Pain in legs when laying flat [] ?Claudication  [] ?Pain in feet when walking [] ?Pain in feet at rest [] ?Pain in feet when laying flat [] ?History of DVT [] ?Phlebitis [x] ?Swelling in legs [x] ?Varicose veins [] ?Non-healing ulcers Pulmonary: [] ?Uses home oxygen [] ?Productive cough[] ?Hemoptysis [] ?Wheeze [] ?COPD [] ?Asthma Neurologic: [] ?Dizziness [] ?Blackouts [] ?Seizures [] ?History of stroke [] ?History of TIA[] ?Aphasia [] ?Temporary blindness[] ?Dysphagia [] ?Weaknessor numbness in arms [] ?Weakness or numbnessin legs Musculoskeletal: [] ?Arthritis [] ?Joint swelling [] ?Joint pain [] ?Low back pain Hematologic:[] ?Easy bruising[] ?Easy bleeding [] ?Hypercoagulable state [] ?Anemic [] ?Hepatitis Gastrointestinal:[] ?Blood in stool[] ?Vomiting blood[] ?Gastroesophageal reflux/heartburn[] ?Difficulty swallowing. Genitourinary: [] ?Chronic kidney disease [] ?Difficulturination [] ?Frequenturination [] ?Burning with urination[] ?Blood in urine Skin: [] ?Rashes [] ?Ulcers [] ?Wounds Psychological: [] ?History of anxiety[] ?History of major depression.    Physical Examination  Vitals:   05/29/18 1055  BP: 130/85  Pulse: 63  Resp: 16  Weight: 186 lb 9.6 oz (84.6 kg)  Height: 5' 6.5" (1.689 m)   Body mass index is 29.67 kg/m. Gen:  WD/WN, NAD Head: [place]/AT, No temporalis wasting. Ear/Nose/Throat: Hearing grossly intact, nares w/o erythema or drainage, trachea midline Eyes: Conjunctiva clear. Sclera non-icteric Neck: Supple.  No bruit  Pulmonary:  Good air movement, equal and clear to auscultation bilaterally.  Cardiac: RRR, No JVD Vascular:  Vessel Right Left  Radial Palpable Palpable               Musculoskeletal: M/S 5/5 throughout.  No deformity or atrophy. No edema. Neurologic: CN 2-12 intact. Sensation grossly intact in extremities.  Symmetrical.  Speech is fluent. Motor exam as listed above. Psychiatric: Judgment intact, Mood & affect appropriate for pt's clinical  situation. Dermatologic: No rashes or ulcers noted.  No cellulitis or open wounds.  CBC Lab Results  Component Value Date   WBC 12.0 (H) 11/10/2013   HGB 12.7 11/10/2013   HCT 38.4 11/10/2013   MCV 91 11/10/2013   PLT 362 11/10/2013    BMET    Component Value Date/Time   NA 137 11/10/2013 0516   K 4.3 11/10/2013 0516   CL 103 11/10/2013 0516   CO2 29 11/10/2013 0516   GLUCOSE 150 (H) 11/10/2013 0516   BUN 4 (L) 11/10/2013 0516   CREATININE 0.77 11/10/2013 0516   CALCIUM 8.8 11/10/2013 0516   GFRNONAA >60 11/10/2013 0516   GFRAA >60 11/10/2013 0516   CrCl cannot be calculated (Patient's most recent lab result is older than the maximum 21 days allowed.).  COAG No results found for: INR, PROTIME  Radiology No results found.    Assessment/Plan Varicose veins of leg with pain, left Continue compression and elevation.  Patient may call to further evaluate with duplex if her symptoms worsen.  Carotid stenosis  Her carotid duplex today shows no hemodynamically significant stenosis in either carotid artery.  Previously, she had been found to have some mild carotid disease on her studies.  At this point, I think we can check this every few years with duplex.    Festus Barren, MD  05/29/2018 11:51 AM    This note was created with Dragon medical transcription system.  Any errors from dictation are purely unintentional

## 2018-05-29 NOTE — Assessment & Plan Note (Signed)
Continue compression and elevation.  Patient may call to further evaluate with duplex if her symptoms worsen.

## 2018-05-29 NOTE — Assessment & Plan Note (Signed)
Her carotid duplex today shows no hemodynamically significant stenosis in either carotid artery.  Previously, she had been found to have some mild carotid disease on her studies.  At this point, I think we can check this every few years with duplex.

## 2019-05-20 ENCOUNTER — Other Ambulatory Visit: Payer: Self-pay | Admitting: Internal Medicine

## 2019-05-20 DIAGNOSIS — Z1231 Encounter for screening mammogram for malignant neoplasm of breast: Secondary | ICD-10-CM

## 2019-06-21 ENCOUNTER — Other Ambulatory Visit: Payer: Self-pay

## 2019-06-21 ENCOUNTER — Ambulatory Visit
Admission: RE | Admit: 2019-06-21 | Discharge: 2019-06-21 | Disposition: A | Discharge status: Home / Self Care | Payer: Managed Care, Other (non HMO) | Source: Ambulatory Visit | Attending: Internal Medicine | Admitting: Internal Medicine

## 2019-06-21 DIAGNOSIS — Z1231 Encounter for screening mammogram for malignant neoplasm of breast: Secondary | ICD-10-CM

## 2020-07-10 ENCOUNTER — Other Ambulatory Visit: Payer: Self-pay | Admitting: Physical Medicine & Rehabilitation

## 2020-07-10 DIAGNOSIS — M5442 Lumbago with sciatica, left side: Secondary | ICD-10-CM

## 2020-07-10 DIAGNOSIS — G8929 Other chronic pain: Secondary | ICD-10-CM

## 2020-07-25 ENCOUNTER — Other Ambulatory Visit (HOSPITAL_COMMUNITY): Payer: Self-pay | Admitting: Family Medicine

## 2020-07-25 ENCOUNTER — Other Ambulatory Visit: Payer: Self-pay | Admitting: Family Medicine

## 2020-07-25 ENCOUNTER — Other Ambulatory Visit: Payer: Self-pay

## 2020-07-25 ENCOUNTER — Ambulatory Visit
Admission: RE | Admit: 2020-07-25 | Discharge: 2020-07-25 | Disposition: A | Discharge status: Home / Self Care | Payer: Managed Care, Other (non HMO) | Source: Ambulatory Visit | Attending: Family Medicine | Admitting: Family Medicine

## 2020-07-25 DIAGNOSIS — M5489 Other dorsalgia: Secondary | ICD-10-CM | POA: Diagnosis present

## 2020-07-25 DIAGNOSIS — R110 Nausea: Secondary | ICD-10-CM | POA: Insufficient documentation

## 2020-07-25 DIAGNOSIS — R1032 Left lower quadrant pain: Secondary | ICD-10-CM | POA: Diagnosis present

## 2020-07-25 MED ORDER — IOHEXOL 300 MG/ML  SOLN
100.0000 mL | Freq: Once | INTRAMUSCULAR | Status: AC | PRN
  Administered 2020-07-25: 100 mL via INTRAVENOUS

## 2020-08-02 ENCOUNTER — Other Ambulatory Visit: Payer: Self-pay | Admitting: Obstetrics and Gynecology

## 2020-08-02 DIAGNOSIS — Z1231 Encounter for screening mammogram for malignant neoplasm of breast: Secondary | ICD-10-CM

## 2020-08-05 ENCOUNTER — Other Ambulatory Visit: Payer: Self-pay

## 2020-08-05 ENCOUNTER — Ambulatory Visit
Admission: RE | Admit: 2020-08-05 | Discharge: 2020-08-05 | Disposition: A | Discharge status: Home / Self Care | Payer: Managed Care, Other (non HMO) | Source: Ambulatory Visit | Attending: Physical Medicine & Rehabilitation | Admitting: Physical Medicine & Rehabilitation

## 2020-08-05 DIAGNOSIS — G8929 Other chronic pain: Secondary | ICD-10-CM

## 2020-08-05 DIAGNOSIS — M5442 Lumbago with sciatica, left side: Secondary | ICD-10-CM

## 2020-08-16 ENCOUNTER — Ambulatory Visit
Admission: RE | Admit: 2020-08-16 | Discharge: 2020-08-16 | Disposition: A | Discharge status: Home / Self Care | Payer: Managed Care, Other (non HMO) | Source: Ambulatory Visit | Attending: Obstetrics and Gynecology | Admitting: Obstetrics and Gynecology

## 2020-08-16 ENCOUNTER — Other Ambulatory Visit: Payer: Self-pay

## 2020-08-16 DIAGNOSIS — Z1231 Encounter for screening mammogram for malignant neoplasm of breast: Secondary | ICD-10-CM | POA: Insufficient documentation

## 2021-04-12 ENCOUNTER — Ambulatory Visit
Admission: RE | Admit: 2021-04-12 | Discharge: 2021-04-12 | Disposition: A | Discharge status: Home / Self Care | Payer: Managed Care, Other (non HMO) | Source: Ambulatory Visit | Attending: Internal Medicine | Admitting: Internal Medicine

## 2021-04-12 ENCOUNTER — Other Ambulatory Visit: Payer: Self-pay | Admitting: Internal Medicine

## 2021-04-12 DIAGNOSIS — R1032 Left lower quadrant pain: Secondary | ICD-10-CM

## 2021-04-12 MED ORDER — IOHEXOL 300 MG/ML  SOLN
100.0000 mL | Freq: Once | INTRAMUSCULAR | Status: AC | PRN
  Administered 2021-04-12: 100 mL via INTRAVENOUS

## 2021-11-12 DIAGNOSIS — S060XAA Concussion with loss of consciousness status unknown, initial encounter: Secondary | ICD-10-CM

## 2021-11-12 HISTORY — DX: Concussion with loss of consciousness status unknown, initial encounter: S06.0XAA

## 2021-12-26 ENCOUNTER — Encounter: Payer: Self-pay | Admitting: Unknown Physician Specialty

## 2021-12-26 NOTE — Discharge Instructions (Signed)
[facility] ENDOSCOPIC SINUS SURGERY [facility]  What is Functional Endoscopic Sinus Surgery?  The Surgery involves making the natural openings of the sinuses larger by removing the bony partitions that separate the sinuses from the nasal cavity.  The natural sinus lining is preserved as much as possible to allow the sinuses to resume normal function after the surgery.  In some patients nasal polyps (excessively swollen lining of the sinuses) may be removed to relieve obstruction of the sinus openings.  The surgery is performed through the nose using lighted scopes, which eliminates the need for incisions on the face.  A septoplasty is a different procedure which is sometimes performed with sinus surgery.  It involves straightening the boy partition that separates the two sides of your nose.  A crooked or deviated septum may need repair if is obstructing the sinuses or nasal airflow.  Turbinate reduction is also often performed during sinus surgery.  The turbinates are bony proturberances from the side walls of the nose which swell and can obstruct the nose in patients with sinus and allergy problems.  Their size can be surgically reduced to help relieve nasal obstruction.  What Can Sinus Surgery Do For Me?  Sinus surgery can reduce the frequency of sinus infections requiring antibiotic treatment.  This can provide improvement in nasal congestion, post-nasal drainage, facial pressure and nasal obstruction.  Surgery will NOT prevent you from ever having an infection again, so it usually only for patients who get infections 4 or more times yearly requiring antibiotics, or for infections that do not clear with antibiotics.  It will not cure nasal allergies, so patients with allergies may still require medication to treat their allergies after surgery. Surgery may improve headaches related to sinusitis, however, some people will continue to  require medication to control sinus headaches related to allergies.  Surgery will do nothing for other forms of headache (migraine, tension or cluster).  What Are the Risks of Endoscopic Sinus Surgery?  Current techniques allow surgery to be performed safely with little risk, however, there are rare complications that patients should be aware of.  Because the sinuses are located around the eyes, there is risk of eye injury, including blindness, though again, this would be quite rare. This is usually a result of bleeding behind the eye during surgery, which can effect vision, though there are treatments to protect the vision and prevent permanent injury. More serious complications would include bleeding inside the brain cavity or damage to the brain.This happens when the fluid around the brain leaks out into the sinus cavity.  Again, all of these complications are uncommon, and spinal fluid leaks can be safely managed surgically if they occur.  The most common complication of sinus surgery is bleeding from the nose, which may require packing or cauterization of the nose.  Patients with polyps may experience recurrence of the polyps that would require revision surgery.  Alterations of sense of smell or injury to the tear ducts are also rare complications.   What is the Surgery Like, and what is the Recovery?  The Surgery usually takes a couple of hours to perform, and is usually performed under a general anesthetic (completely asleep).  Patients are usually discharged home after a couple of hours.  Sometimes during surgery it is necessary to pack the nose to control bleeding, and the packing is left in place for 24 - 48 hours, and removed by your surgeon.  If   a septoplasty was performed during the procedure, there is often a splint placed which must be removed after 5-7 days.   Discomfort: Pain is usually mild to moderate, and can be controlled by prescription pain medication or acetaminophen (Tylenol).   Aspirin, Ibuprofen (Advil, Motrin), or Naprosyn (Aleve) should be avoided, as they can cause increased bleeding.  Most patients feel sinus pressure like they have a bad head cold for several days.  Sleeping with your head elevated can help reduce swelling and facial pressure, as can ice packs over the face.  A humidifier may be helpful to keep the mucous and blood from drying in the nose.   Diet: There are no specific diet restrictions, however, you should generally start with clear liquids and a light diet of bland foods because the anesthetic can cause some nausea.  Advance your diet depending on how your stomach feels.  Taking your pain medication with food will often help reduce stomach upset which pain medications can cause.  Nasal Saline Irrigation: It is important to remove blood clots and dried mucous from the nose as it is healing.  This is done by having you irrigate the nose at least 3 - 4 times daily with a salt water solution.  We recommend using NeilMed Sinus Rinse (available at the drug store).  Fill the squeeze bottle with the solution, bend over a sink, and insert the tip of the squeeze bottle into the nose  of an inch.  Point the tip of the squeeze bottle towards the inside corner of the eye on the same side your irrigating.  Squeeze the bottle and gently irrigate the nose.  If you bend forward as you do this, most of the fluid will flow back out of the nose, instead of down your throat.   The solution should be warm, near body temperature, when you irrigate.   Each time you irrigate, you should use a full squeeze bottle.   Note that if you are instructed to use Nasal Steroid Sprays at any time after your surgery, irrigate with saline BEFORE using the steroid spray, so you do not wash it all out of the nose. Another product, Nasal Saline Gel (such as AYR Nasal Saline Gel) can be applied in each nostril 3 - 4 times daily to moisture the nose and reduce scabbing or crusting.  Bleeding:   Bloody drainage from the nose can be expected for several days, and patients are instructed to irrigate their nose frequently with salt water to help remove mucous and blood clots.  The drainage may be dark red or brown, though some fresh blood may be seen intermittently, especially after irrigation.  Do not blow you nose, as bleeding may occur. If you must sneeze, keep your mouth open to allow air to escape through your mouth.  If heavy bleeding occurs: Irrigate the nose with saline to rinse out clots, then spray the nose 3 - 4 times with Afrin Nasal Decongestant Spray.  The spray will constrict the blood vessels to slow bleeding.  Pinch the lower half of your nose shut to apply pressure, and lay down with your head elevated.  Ice packs over the nose may help as well. If bleeding persists despite these measures, you should notify your doctor.  Do not use the Afrin routinely to control nasal congestion after surgery, as it can result in worsening congestion and may affect healing.     Activity: Return to work varies among patients. Most patients will be out   of work at least 5 - 7 days to recover.  Patient may return to work after they are off of narcotic pain medication, and feeling well enough to perform the functions of their job.  Patients must avoid heavy lifting (over 10 pounds) or strenuous physical for 2 weeks after surgery, so your employer may need to assign you to light duty, or keep you out of work longer if light duty is not possible.  NOTE: you should not drive, operate dangerous machinery, do any mentally demanding tasks or make any important legal or financial decisions while on narcotic pain medication and recovering from the general anesthetic.    Call Your Doctor Immediately if You Have Any of the Following: Bleeding that you cannot control with the above measures Loss of vision, double vision, bulging of the eye or black eyes. Fever over 101 degrees Neck stiffness with severe headache,  fever, nausea and change in mental state. You are always encouraged to call anytime with concerns, however, please call with requests for pain medication refills during office hours.  Office Endoscopy: During follow-up visits your doctor will remove any packing or splints that may have been placed and evaluate and clean your sinuses endoscopically.  Topical anesthetic will be used to make this as comfortable as possible, though you may want to take your pain medication prior to the visit.  How often this will need to be done varies from patient to patient.  After complete recovery from the surgery, you may need follow-up endoscopy from time to time, particularly if there is concern of recurrent infection or nasal polyps.  

## 2021-12-27 NOTE — Anesthesia Preprocedure Evaluation (Deleted)
Anesthesia Evaluation  Patient identified by MRN, date of birth, ID band Patient awake    Reviewed: Allergy & Precautions, NPO status , Patient's Chart, lab work & pertinent test results  History of Anesthesia Complications (+) history of anesthetic complications (slow to wake (thinks it was after bariatric surgery))  Airway Mallampati: II  TM Distance: >3 FB Neck ROM: full    Dental no notable dental hx.    Pulmonary neg pulmonary ROS,    Pulmonary exam normal        Cardiovascular hypertension, Normal cardiovascular exam+ dysrhythmias (PVC)   11/2021 patient had syncope in [facility] at [employer] (hot weather, poor PO intake, alcohol), worked up in hospital, told she had prolonged QTC. Unremarkable at cardiologist office. See below.  Per cardiology: "She underwent previous cardiac catheterization 2010 which revealed normal coronary anatomy. 2D echocardiogram 12/30/2014 revealed normal left ventricular function, with LVEF grain 55% with mild mitral tricuspid regurgitation. 72-hour Holter monitor revealed predominant sinus rhythm with mean heart rate of 74 bpm. Frequent premature ventricular contractions were present."   Neuro/Psych negative neurological ROS  negative psych ROS   GI/Hepatic negative GI ROS, Neg liver ROS,   Endo/Other  negative endocrine ROS  Renal/GU      Musculoskeletal   Abdominal (+) + obese,   Peds  Hematology negative hematology ROS (+)   Anesthesia Other Findings Past Medical History: No date: Anemia No date: Arthritis     Comment:  lower back No date: Carotid artery occlusion- Carotid ultrasound was negative for hemodynamically significant stenosis. No date: Complication of anesthesia     Comment:  slow to wake (thinks it was after bariatric surgery) 11/12/2021: Concussion     Comment:  S/P syncopal episode No date: Hypercholesteremia No date: Hypertension No date: Mitral valve  regurgitation No date: Palpitations No date: PVCs (premature ventricular contractions)  Past Surgical History: 2011: CHOLECYSTECTOMY No date: COLONOSCOPY WITH ESOPHAGOGASTRODUODENOSCOPY (EGD)     Comment:  2012, 2013, 2014, 2021 10/2013: LAPAROSCOPIC GASTRIC SLEEVE RESECTION WITH HIATAL HERNIA  REPAIR  BMI    Body Mass Index: 31.96 kg/m      Reproductive/Obstetrics negative OB ROS                           Anesthesia Physical Anesthesia Plan  ASA: 2  Anesthesia Plan: General ETT   Post-op Pain Management: Tylenol PO (pre-op) and Toradol IV (intra-op)   Induction: Intravenous  PONV Risk Score and Plan: 3 and Ondansetron, Dexamethasone and Midazolam  Airway Management Planned: Oral ETT  Additional Equipment:   Intra-op Plan:   Post-operative Plan:   Informed Consent:   Plan Discussed with: Anesthesiologist, CRNA and Surgeon  Anesthesia Plan Comments:         Anesthesia Quick Evaluation

## 2021-12-31 ENCOUNTER — Other Ambulatory Visit: Payer: Self-pay | Admitting: Unknown Physician Specialty

## 2021-12-31 ENCOUNTER — Ambulatory Visit
Admission: RE | Admit: 2021-12-31 | Discharge: 2021-12-31 | Disposition: A | Discharge status: Home / Self Care | Payer: Managed Care, Other (non HMO) | Source: Ambulatory Visit | Attending: Unknown Physician Specialty | Admitting: Unknown Physician Specialty

## 2021-12-31 DIAGNOSIS — R059 Cough, unspecified: Secondary | ICD-10-CM

## 2022-01-18 ENCOUNTER — Ambulatory Visit: Payer: Managed Care, Other (non HMO) | Admitting: Anesthesiology

## 2022-01-18 ENCOUNTER — Ambulatory Visit (AMBULATORY_SURGERY_CENTER): Payer: Managed Care, Other (non HMO) | Admitting: Anesthesiology

## 2022-01-18 ENCOUNTER — Ambulatory Visit
Admission: RE | Admit: 2022-01-18 | Discharge: 2022-01-18 | Disposition: A | Discharge status: Home / Self Care | Payer: Managed Care, Other (non HMO) | Attending: Unknown Physician Specialty | Admitting: Unknown Physician Specialty

## 2022-01-18 ENCOUNTER — Encounter
Admission: RE | Disposition: A | Discharge status: Home / Self Care | Payer: Self-pay | Source: Home / Self Care | Attending: Unknown Physician Specialty

## 2022-01-18 ENCOUNTER — Encounter: Payer: Self-pay | Admitting: Unknown Physician Specialty

## 2022-01-18 ENCOUNTER — Other Ambulatory Visit: Payer: Self-pay

## 2022-01-18 DIAGNOSIS — J342 Deviated nasal septum: Secondary | ICD-10-CM | POA: Insufficient documentation

## 2022-01-18 DIAGNOSIS — J3489 Other specified disorders of nose and nasal sinuses: Secondary | ICD-10-CM | POA: Diagnosis not present

## 2022-01-18 DIAGNOSIS — I10 Essential (primary) hypertension: Secondary | ICD-10-CM | POA: Diagnosis not present

## 2022-01-18 DIAGNOSIS — J343 Hypertrophy of nasal turbinates: Secondary | ICD-10-CM | POA: Diagnosis not present

## 2022-01-18 HISTORY — DX: Unspecified osteoarthritis, unspecified site: M19.90

## 2022-01-18 HISTORY — DX: Palpitations: R00.2

## 2022-01-18 HISTORY — PX: NASAL SEPTOPLASTY W/ TURBINOPLASTY: SHX2070

## 2022-01-18 HISTORY — DX: Nonrheumatic mitral (valve) insufficiency: I34.0

## 2022-01-18 HISTORY — DX: Essential (primary) hypertension: I10

## 2022-01-18 HISTORY — DX: Other complications of anesthesia, initial encounter: T88.59XA

## 2022-01-18 HISTORY — DX: Pure hypercholesterolemia, unspecified: E78.00

## 2022-01-18 HISTORY — DX: Anemia, unspecified: D64.9

## 2022-01-18 HISTORY — DX: Ventricular premature depolarization: I49.3

## 2022-01-18 SURGERY — SEPTOPLASTY, NOSE, WITH NASAL TURBINATE REDUCTION
Anesthesia: General | Laterality: Bilateral

## 2022-01-18 MED ORDER — OXYCODONE HCL 5 MG PO TABS
5.0000 mg | ORAL_TABLET | Freq: Once | ORAL | Status: AC | PRN
  Administered 2022-01-18: 5 mg via ORAL

## 2022-01-18 MED ORDER — OXYCODONE-ACETAMINOPHEN 5-325 MG PO TABS: 1.0000 | ORAL_TABLET | ORAL | 0 refills | Status: AC | PRN

## 2022-01-18 MED ORDER — LIDOCAINE HCL (CARDIAC) PF 100 MG/5ML IV SOSY
PREFILLED_SYRINGE | INTRAVENOUS | Status: DC | PRN
  Administered 2022-01-18: 100 mg via INTRAVENOUS

## 2022-01-18 MED ORDER — ROCURONIUM BROMIDE 100 MG/10ML IV SOLN
INTRAVENOUS | Status: DC | PRN
  Administered 2022-01-18: 40 mg via INTRAVENOUS

## 2022-01-18 MED ORDER — BACITRACIN 500 UNIT/GM EX OINT
TOPICAL_OINTMENT | CUTANEOUS | Status: DC | PRN
  Administered 2022-01-18: 1 via TOPICAL

## 2022-01-18 MED ORDER — PROMETHAZINE HCL 25 MG/ML IJ SOLN: 6.2500 mg | INTRAMUSCULAR | Status: DC | PRN

## 2022-01-18 MED ORDER — OXYMETAZOLINE HCL 0.05 % NA SOLN
6.0000 | Freq: Once | NASAL | Status: AC
  Administered 2022-01-18: 6 via NASAL

## 2022-01-18 MED ORDER — PROPOFOL 10 MG/ML IV BOLUS
INTRAVENOUS | Status: DC | PRN
  Administered 2022-01-18: 200 mg via INTRAVENOUS

## 2022-01-18 MED ORDER — ACETAMINOPHEN 10 MG/ML IV SOLN: 1000.0000 mg | Freq: Once | INTRAVENOUS | Status: DC | PRN

## 2022-01-18 MED ORDER — FENTANYL CITRATE (PF) 100 MCG/2ML IJ SOLN
INTRAMUSCULAR | Status: DC | PRN
Start: 2022-01-18 — End: 2022-01-18
  Administered 2022-01-18 (×2): 50 ug via INTRAVENOUS

## 2022-01-18 MED ORDER — DROPERIDOL 2.5 MG/ML IJ SOLN: 0.6250 mg | Freq: Once | INTRAMUSCULAR | Status: DC | PRN

## 2022-01-18 MED ORDER — ONDANSETRON HCL 4 MG/2ML IJ SOLN
INTRAMUSCULAR | Status: DC | PRN
  Administered 2022-01-18: 4 mg via INTRAVENOUS

## 2022-01-18 MED ORDER — PHENYLEPHRINE HCL 0.5 % NA SOLN
NASAL | Status: DC | PRN
  Administered 2022-01-18: 10 mL via NASAL

## 2022-01-18 MED ORDER — LIDOCAINE-EPINEPHRINE 1 %-1:100000 IJ SOLN
INTRAMUSCULAR | Status: DC | PRN
  Administered 2022-01-18: 19 mL

## 2022-01-18 MED ORDER — ONDANSETRON HCL 4 MG PO TABS: 4.0000 mg | ORAL_TABLET | Freq: Three times a day (TID) | ORAL | 0 refills | Status: DC | PRN

## 2022-01-18 MED ORDER — ACETAMINOPHEN 500 MG PO TABS
1000.0000 mg | ORAL_TABLET | Freq: Once | ORAL | Status: AC
  Administered 2022-01-18: 1000 mg via ORAL

## 2022-01-18 MED ORDER — FLUCONAZOLE 100 MG PO TABS: 100.0000 mg | ORAL_TABLET | Freq: Every day | ORAL | 0 refills | Status: AC

## 2022-01-18 MED ORDER — DEXAMETHASONE SODIUM PHOSPHATE 4 MG/ML IJ SOLN
INTRAMUSCULAR | Status: DC | PRN
  Administered 2022-01-18: 4 mg via INTRAVENOUS

## 2022-01-18 MED ORDER — OXYCODONE HCL 5 MG/5ML PO SOLN: 5.0000 mg | Freq: Once | ORAL | Status: AC | PRN

## 2022-01-18 MED ORDER — FENTANYL CITRATE PF 50 MCG/ML IJ SOSY: 25.0000 ug | PREFILLED_SYRINGE | INTRAMUSCULAR | Status: DC | PRN

## 2022-01-18 MED ORDER — SUGAMMADEX SODIUM 200 MG/2ML IV SOLN
INTRAVENOUS | Status: DC | PRN
  Administered 2022-01-18: 200 mg via INTRAVENOUS

## 2022-01-18 MED ORDER — LACTATED RINGERS IV SOLN: INTRAVENOUS | Status: DC

## 2022-01-18 MED ORDER — SCOPOLAMINE 1 MG/3DAYS TD PT72
1.0000 | MEDICATED_PATCH | TRANSDERMAL | Status: DC
  Administered 2022-01-18: 1.5 mg via TRANSDERMAL

## 2022-01-18 MED ORDER — CLINDAMYCIN HCL 300 MG PO CAPS: 300.0000 mg | ORAL_CAPSULE | Freq: Three times a day (TID) | ORAL | 0 refills | Status: AC

## 2022-01-18 SURGICAL SUPPLY — 24 items
COAG SUCT 10F 3.5MM HAND CTRL (MISCELLANEOUS) ×2 IMPLANT
DRAPE HEAD BAR (DRAPES) ×2 IMPLANT
DRESSING NASL FOAM PST OP SINU (MISCELLANEOUS) IMPLANT
DRSG NASAL FOAM POST OP SINU (MISCELLANEOUS) ×2
ELECT REM PT RETURN 9FT ADLT (ELECTROSURGICAL) ×1
ELECTRODE REM PT RTRN 9FT ADLT (ELECTROSURGICAL) ×2 IMPLANT
GLOVE SURG ENC TEXT LTX SZ7.5 (GLOVE) ×4 IMPLANT
HANDLE YANKAUER SUCT BULB TIP (MISCELLANEOUS) ×2 IMPLANT
KIT TURNOVER KIT A (KITS) ×2 IMPLANT
NDL HYPO 25GX1X1/2 BEV (NEEDLE) ×2 IMPLANT
NEEDLE HYPO 25GX1X1/2 BEV (NEEDLE) ×1 IMPLANT
PACK ENT CUSTOM (PACKS) ×2 IMPLANT
SPLINT NASAL SEPTAL BLV .25 LG (MISCELLANEOUS) IMPLANT
SPLINT NASAL SEPTAL BLV .50 ST (MISCELLANEOUS) IMPLANT
SPONGE NEURO XRAY DETECT 1X3 (DISPOSABLE) ×2 IMPLANT
STRAP BODY AND KNEE 60X3 (MISCELLANEOUS) ×2 IMPLANT
SUT CHROMIC 3-0 (SUTURE) ×1
SUT CHROMIC 3-0 KS 27XMFL CR (SUTURE) ×1
SUT ETHILON 3-0 KS 30 BLK (SUTURE) ×2 IMPLANT
SUT PLAIN GUT 4-0 (SUTURE) IMPLANT
SUTURE CHRMC 3-0 KS 27XMFL CR (SUTURE) ×2 IMPLANT
SYR 10ML LL (SYRINGE) ×2 IMPLANT
TOWEL OR 17X26 4PK STRL BLUE (TOWEL DISPOSABLE) ×2 IMPLANT
WATER STERILE IRR 250ML POUR (IV SOLUTION) ×2 IMPLANT

## 2022-01-18 NOTE — H&P (Signed)
The patient's history has been reviewed, patient examined, no change in status, stable for surgery.  Questions were answered to the patients satisfaction.  

## 2022-01-18 NOTE — Anesthesia Preprocedure Evaluation (Signed)
Anesthesia Evaluation  Patient identified by MRN, date of birth, ID band Patient awake    Reviewed: Allergy & Precautions, H&P , NPO status , Patient's Chart, lab work & pertinent test results, reviewed documented beta blocker date and time   History of Anesthesia Complications (+) PROLONGED EMERGENCE and history of anesthetic complications  Airway Mallampati: I  TM Distance: >3 FB Neck ROM: full    Dental  (+) Dental Advidsory Given, Chipped, Teeth Intact   Pulmonary neg shortness of breath, neg COPD, Recent URI , Resolved,    Pulmonary exam normal breath sounds clear to auscultation       Cardiovascular Exercise Tolerance: Good hypertension (history of), (-) angina(-) Past MI and (-) Cardiac Stents Normal cardiovascular exam+ dysrhythmias (PVCs) + Valvular Problems/Murmurs MR  Rhythm:regular Rate:Normal     Neuro/Psych negative neurological ROS  negative psych ROS   GI/Hepatic negative GI ROS, Neg liver ROS,   Endo/Other  negative endocrine ROS  Renal/GU negative Renal ROS  negative genitourinary   Musculoskeletal   Abdominal   Peds  Hematology negative hematology ROS (+)   Anesthesia Other Findings Past Medical History: No date: Anemia No date: Arthritis     Comment:  lower back No date: Carotid artery occlusion No date: Complication of anesthesia     Comment:  slow to wake (thinks it was after bariatric surgery) 11/12/2021: Concussion     Comment:  S/P syncopal episode No date: Hypercholesteremia No date: Hypertension No date: Mitral valve regurgitation No date: Palpitations No date: PVCs (premature ventricular contractions)   Reproductive/Obstetrics negative OB ROS                             Anesthesia Physical Anesthesia Plan  ASA: 2  Anesthesia Plan: General   Post-op Pain Management:    Induction: Intravenous  PONV Risk Score and Plan: 3 and Ondansetron,  Dexamethasone, Midazolam and Treatment may vary due to age or medical condition  Airway Management Planned: Oral ETT  Additional Equipment:   Intra-op Plan:   Post-operative Plan: Extubation in OR  Informed Consent: I have reviewed the patients History and Physical, chart, labs and discussed the procedure including the risks, benefits and alternatives for the proposed anesthesia with the patient or authorized representative who has indicated his/her understanding and acceptance.     Dental Advisory Given  Plan Discussed with: Anesthesiologist, CRNA and Surgeon  Anesthesia Plan Comments:         Anesthesia Quick Evaluation

## 2022-01-18 NOTE — Anesthesia Postprocedure Evaluation (Signed)
Anesthesia Post Note  Patient: Cheryl Murillo  Procedure(s) Performed: NASAL SEPTOPLASTY WITH SUBMUCOSAL RESECTION OF TURBINATES (Bilateral)     Patient location during evaluation: PACU Anesthesia Type: General Level of consciousness: awake and alert Pain management: pain level controlled Vital Signs Assessment: post-procedure vital signs reviewed and stable Respiratory status: spontaneous breathing, nonlabored ventilation, respiratory function stable and patient connected to nasal cannula oxygen Cardiovascular status: blood pressure returned to baseline and stable Postop Assessment: no apparent nausea or vomiting Anesthetic complications: no   No notable events documented.  Lenard Simmer

## 2022-01-18 NOTE — Anesthesia Procedure Notes (Signed)
Procedure Name: Intubation Date/Time: 01/18/2022 11:34 AM  Performed by: Griffin Dakin, CRNAPre-anesthesia Checklist: Patient identified, Emergency Drugs available, Suction available and Patient being monitored Patient Re-evaluated:Patient Re-evaluated prior to induction Oxygen Delivery Method: Circle system utilized Preoxygenation: Pre-oxygenation with 100% oxygen Induction Type: IV induction Ventilation: Mask ventilation without difficulty Laryngoscope Size: Mac and 4 Grade View: Grade II Tube type: Oral Tube size: 7.0 mm Number of attempts: 1 Airway Equipment and Method: Stylet and Oral airway Placement Confirmation: ETT inserted through vocal cords under direct vision, positive ETCO2 and breath sounds checked- equal and bilateral Secured at: 23 cm Tube secured with: Tape Dental Injury: Teeth and Oropharynx as per pre-operative assessment

## 2022-01-18 NOTE — Op Note (Signed)
PREOPERATIVE DIAGNOSIS:  Chronic nasal obstruction.  POSTOPERATIVE DIAGNOSIS:  Chronic nasal obstruction.  SURGEON:  Davina Poke, M.D.  NAME OF PROCEDURE:  Nasal septoplasty. Submucous resection of inferior turbinates.  OPERATIVE FINDINGS:  Severe nasal septal deformity, hypertrophy of the inferior turbinates.   DESCRIPTION OF THE PROCEDURE:  Cheryl Murillo was identified in the holding area and taken to the operating room and placed in the supine position.  After general endotracheal anesthesia was induced, the table was turned 45 degrees and the patient was placed in a semi-Fowler position.  The nose was then topically anesthetized with Lidocaine, cotton pledgets were placed within each nostril. After approximately 5 minutes, this was removed at which time a local anesthetic of 1% Lidocaine 1:100,000 units of Epinephrine was used to inject the inferior turbinates in the nasal septum. A total of 12 ml was used. Examination of the nose showed a severe left nasal septal deformity and hypertrophied inferior turbinate.  Beginning on the right hand side a hemitransfixion incision was then created on the leading edge of the septum on the right.  A subperichondrial plane was elevated posteriorly on the left and taken back to the perpendicular plate of the ethmoid where subperiosteal plane was elevated posteriorly on the left.   There appeared to have been previous trauma to the septum causing a severe anterior deflection and what appeared to be a small fracture line in the cartilage just at the edge of the bend.  This fracture line was opened allowing access to the right side of the perichondrium which was elevated posteriorly.  An inferior rim of cartilage was removed anteriorly with care taken to leave an anterior strut to prevent nasal collapse. With this strut removed the perpendicular plate of the ethmoid was separated from the quadrangular cartilage.  This allowed the cartilage to swing back  in the midline.  The septum was then replaced in the midline. Reinspection through each nostril showed excellent reduction of the septal deformity. A left posterior inferior fenestration was then created to allow hematoma drainage.  A 5-0 chromic was placed through and through the septum in a whipstitch fashion to maintain the structure of the septum and pull the mucosa back onto this anterior cartilage which had been reshaped and placed in the midline.  This gave excellent reduction of the deviation and easily held the septum in the midline position.  With the septoplasty completed, beginning on the left-hand side, a 15 blade was used to incise along the inferior edge of the inferior turbinate. A superior laterally based flap was then elevated. The underlying conchal bone of mucosa was excised using Knight scissors. The flap was then laid back over the turbinate stump and cauterized using suction cautery. In a similar fashion the submucous resection was performed on the right.  With the submucous resection completed bilaterally and no active bleeding, the hemitransfixion incision was then closed using two interrupted 3-0 chromic sutures.  Plastic nasal septal splints were placed within each nostril and affixed to the septum using a 3-0 nylon suture. Stammberger was then used beneath each inferior turbinate for hemostasis.    The patient tolerated the procedure well, was returned to anesthesia, extubated in the operating room, and taken to the recovery room in stable condition.    CULTURES:  None.  SPECIMENS:  None.  ESTIMATED BLOOD LOSS:  25 cc.  Davina Poke  01/18/2022  12:24 PM

## 2022-01-18 NOTE — Transfer of Care (Signed)
Immediate Anesthesia Transfer of Care Note  Patient: Cheryl Murillo  Procedure(s) Performed: NASAL SEPTOPLASTY WITH SUBMUCOSAL RESECTION OF TURBINATES (Bilateral)  Patient Location: PACU  Anesthesia Type: General  Level of Consciousness: awake, alert  and patient cooperative  Airway and Oxygen Therapy: Patient Spontanous Breathing and Patient connected to supplemental oxygen  Post-op Assessment: Post-op Vital signs reviewed, Patient's Cardiovascular Status Stable, Respiratory Function Stable, Patent Airway and No signs of Nausea or vomiting  Post-op Vital Signs: Reviewed and stable  Complications: No notable events documented.

## 2022-01-21 ENCOUNTER — Encounter: Payer: Self-pay | Admitting: Unknown Physician Specialty

## 2022-05-27 IMAGING — CT CT ABD-PELV W/ CM
2 of 5 series · 15 of 46 positions shown, 17 images · IV contrast (APPLIED)
Comparison: CT 08/02/2016

CLINICAL DATA: Left lower quadrant pain today.  Nausea.

EXAM:
CT ABDOMEN AND PELVIS WITH CONTRAST
TECHNIQUE: Multidetector CT imaging of the abdomen and pelvis was performed
using the standard protocol following bolus administration of
intravenous contrast.
CONTRAST:  100mL OMNIPAQUE IOHEXOL 300 MG/ML  SOLN

[Series 2: routine abd/pel with · axial · 0.79mm/px · z∈[-505,-60]mm · 12 of 101 slices shown, 14 images]
[im 6/101  soft-tissue]
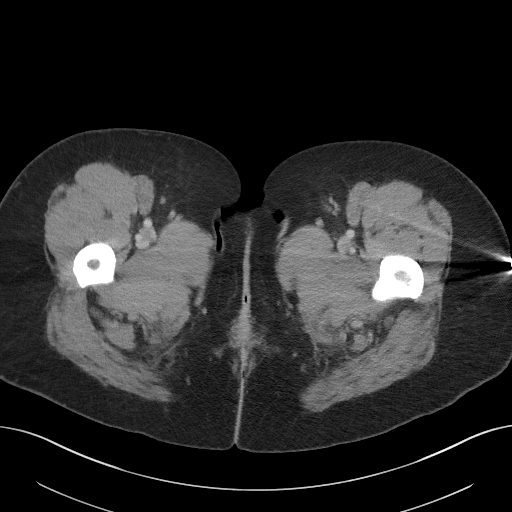
[im 6/101  bone]
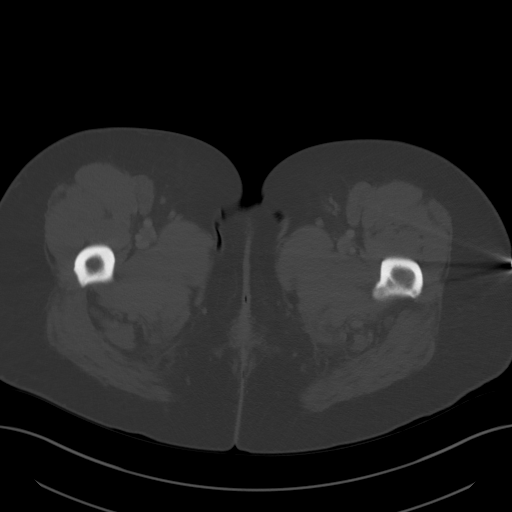
[im 17/101  soft-tissue]
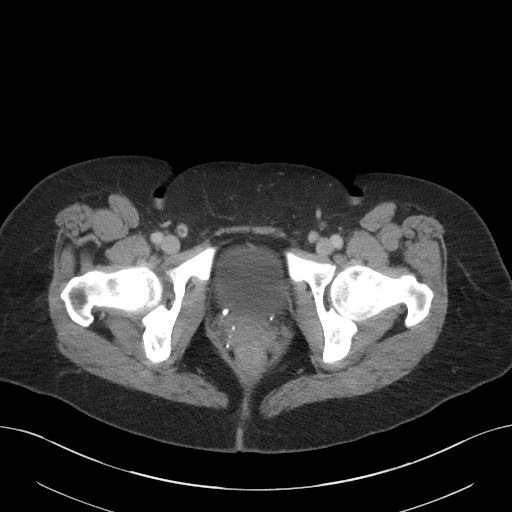
[im 23/101  soft-tissue]
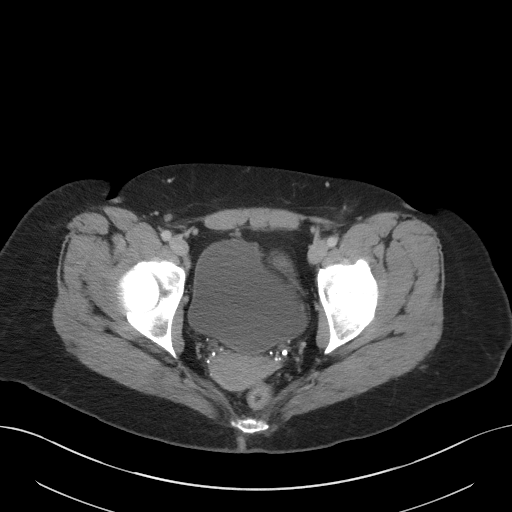
[im 28/101  soft-tissue]
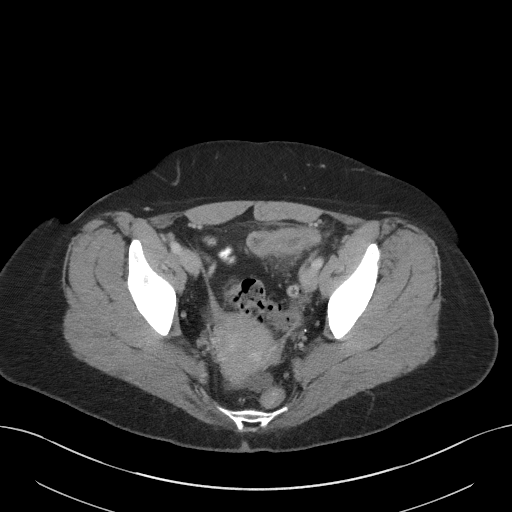
[im 39/101  soft-tissue]
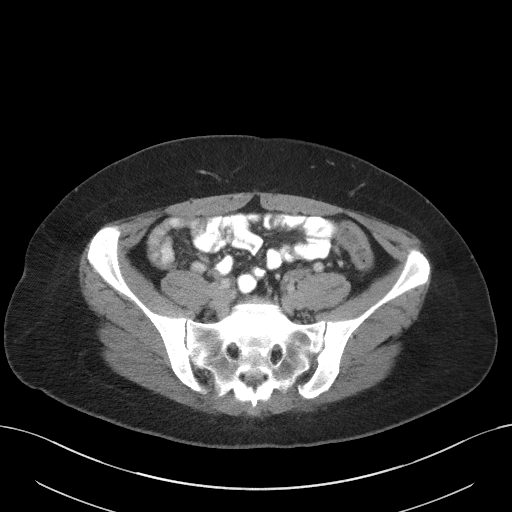
[im 45/101  soft-tissue]
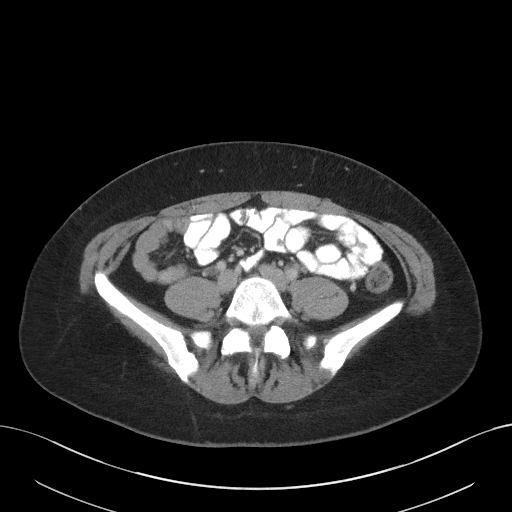
[im 56/101  soft-tissue]
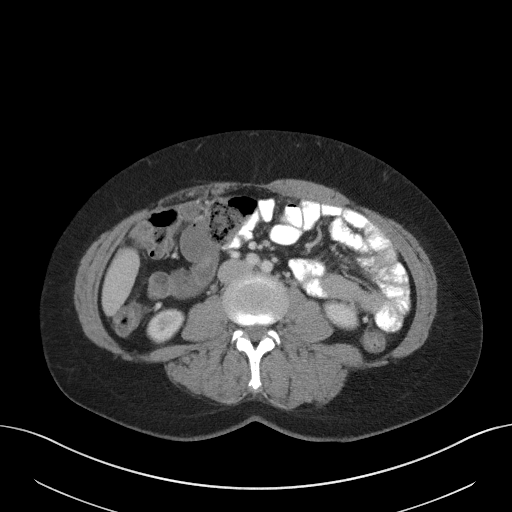
[im 62/101  soft-tissue]
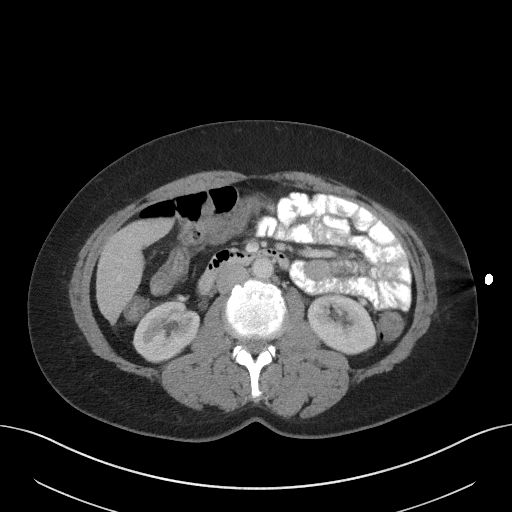
[im 73/101  soft-tissue]
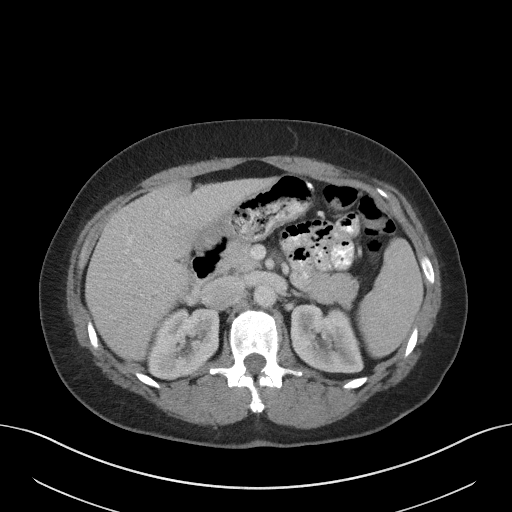
[im 73/101  bone]
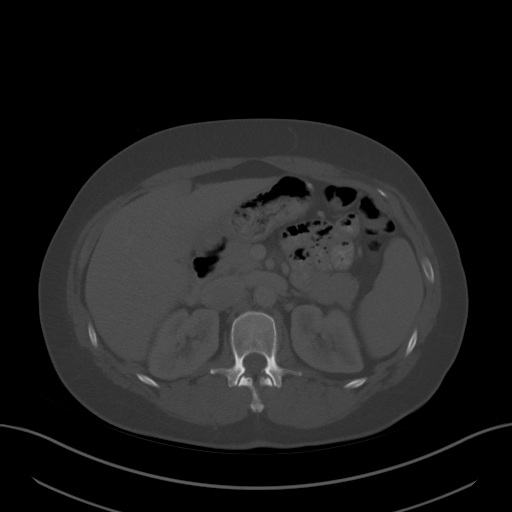
[im 78/101  soft-tissue]
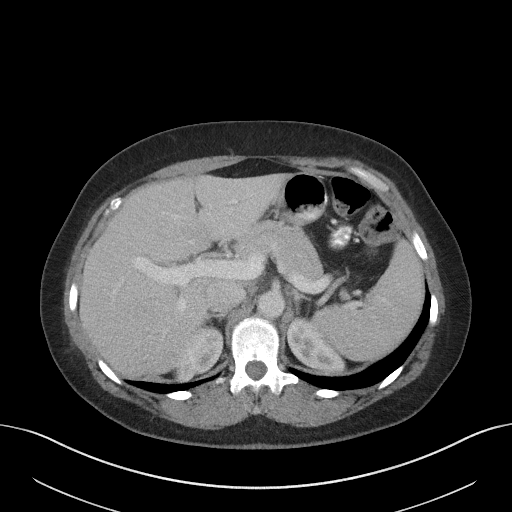
[im 84/101  soft-tissue]
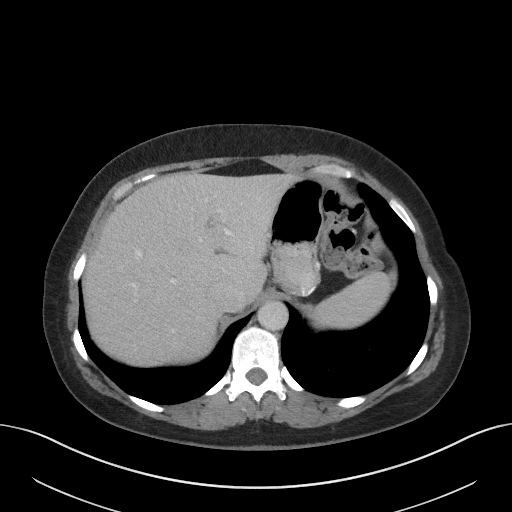
[im 95/101  soft-tissue]
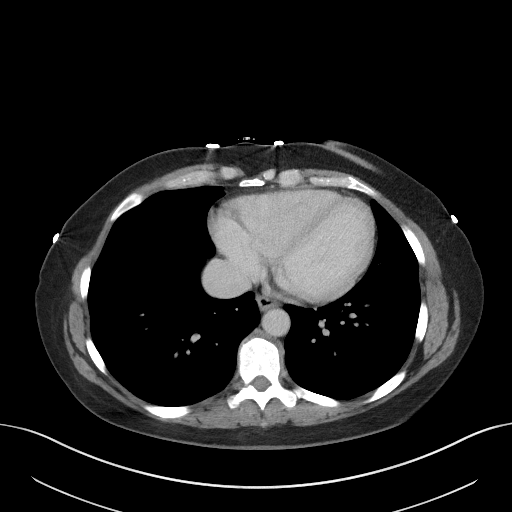

[Series 5: coronal st · coronal · 0.73mm/px · 3 of 90 slices shown]
[im 30/90  soft-tissue]
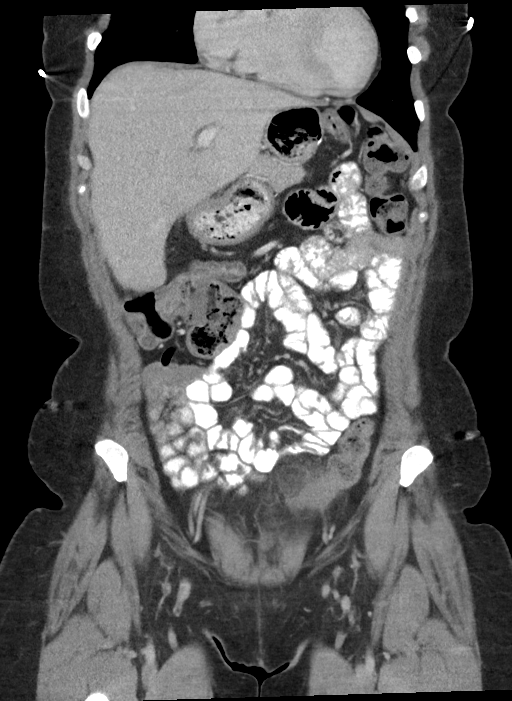
[im 40/90  soft-tissue]
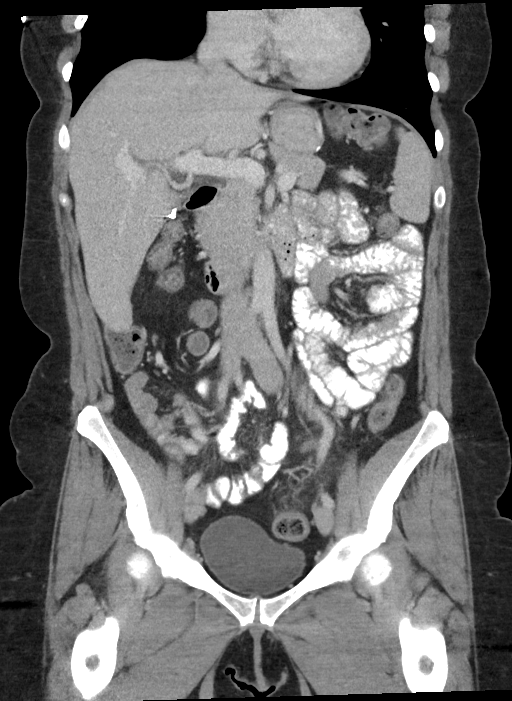
[im 50/90  soft-tissue]
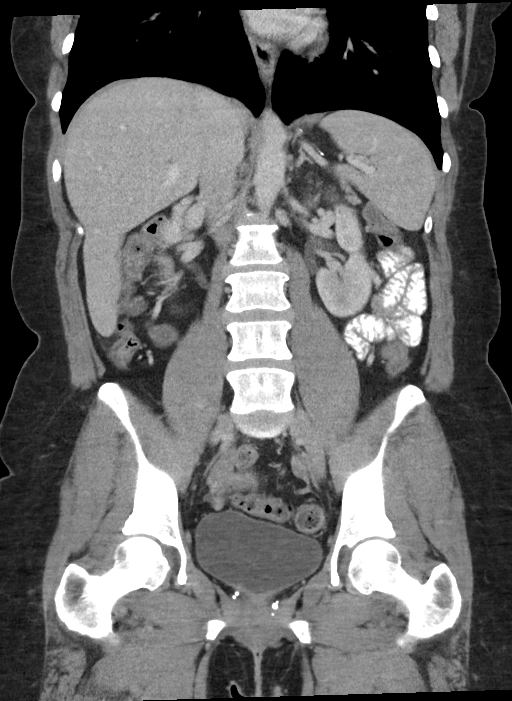

[15 of 46 positions shown; findings below may reference images not displayed]

FINDINGS: Lower chest: Lung bases are clear. No focal airspace disease or
pleural fluid.

Hepatobiliary: No focal liver abnormality is seen. Status post
cholecystectomy. No biliary dilatation.

Pancreas: No ductal dilatation or inflammation.

Spleen: Normal in size without focal abnormality.

Adrenals/Urinary Tract: Normal adrenal glands. No hydronephrosis or
perinephric edema. Homogeneous renal enhancement with symmetric
excretion on delayed phase imaging. Urinary bladder is
physiologically distended without wall thickening.

Stomach/Bowel: Inflamed diverticulum involving the proximal sigmoid
colon left lower quadrant most consistent diverticulitis. There is
pericolonic fat stranding and edema with associated colonic wall
thickening. No evidence of perforation or abscess. Few scattered
additional noninflamed colonic diverticula in the sigmoid. Small to
moderate stool burden in the more proximal colon. High-riding cecum
in the right mid abdomen. Normal air-filled appendix. Normal small
bowel without wall thickening, inflammation or obstruction. Chain
sutures about the greater curvature of the stomach consistent with
gastric sleeve. No gastric wall thickening.

Vascular/Lymphatic: Normal caliber abdominal aorta. Patent portal
vein. No portal venous or mesenteric gas. No evidence of mesenteric
vein thrombosis. No enlarged lymph nodes in the abdomen or pelvis.

Reproductive: Heterogeneous uterus but no discrete fibroid.
Quiescent ovaries. No adnexal mass.

Other: Fat stranding and trace free fluid in the left lower quadrant
related to colonic diverticulitis. Trace free fluid in the dependent
pelvis is likely reactive. No free air or abscess. No abdominal wall
hernia.

Musculoskeletal: Limbic vertebra at L3, chronic. Occasional bone
islands in the pelvis. There are no acute or suspicious osseous
abnormalities.
IMPRESSION: 1. Acute uncomplicated sigmoid diverticulitis. No perforation or
abscess.
2. Post gastric sleeve without complication.

These results will be called to the ordering clinician or
representative by the Radiologist Assistant, and communication
documented in the PACS or [REDACTED].

## 2022-11-10 ENCOUNTER — Other Ambulatory Visit: Payer: Self-pay

## 2022-11-10 ENCOUNTER — Encounter: Payer: Self-pay | Admitting: Emergency Medicine

## 2022-11-10 ENCOUNTER — Ambulatory Visit
Admission: EM | Admit: 2022-11-10 | Discharge: 2022-11-10 | Disposition: A | Discharge status: Home / Self Care | Payer: Managed Care, Other (non HMO) | Attending: Emergency Medicine | Admitting: Emergency Medicine

## 2022-11-10 DIAGNOSIS — N390 Urinary tract infection, site not specified: Secondary | ICD-10-CM | POA: Insufficient documentation

## 2022-11-10 LAB — URINALYSIS, W/ REFLEX TO CULTURE (INFECTION SUSPECTED)
Bilirubin Urine: NEGATIVE
Glucose, UA: NEGATIVE mg/dL
Ketones, ur: NEGATIVE mg/dL
Nitrite: NEGATIVE
Protein, ur: 30 mg/dL — AB
Specific Gravity, Urine: 1.02 (ref 1.005–1.030)
pH: 6 (ref 5.0–8.0)

## 2022-11-10 MED ORDER — PHENAZOPYRIDINE HCL 200 MG PO TABS: 200.0000 mg | ORAL_TABLET | Freq: Three times a day (TID) | ORAL | 0 refills | Status: DC

## 2022-11-10 MED ORDER — CIPROFLOXACIN HCL 500 MG PO TABS: 500.0000 mg | ORAL_TABLET | Freq: Two times a day (BID) | ORAL | 0 refills | Status: AC

## 2022-11-10 NOTE — ED Triage Notes (Signed)
Back pain for 2 days, bloody urine for 1 day.  Patient has felt urgency a few times yesterday.   History of the same  Took a dose of left over cipro last night.

## 2022-11-10 NOTE — ED Provider Notes (Signed)
[facility]    CSN: [id number] Arrival date & time: 11/10/22  4098      History   Chief Complaint Chief Complaint  Patient presents with   Back Pain    HPI Cheryl Murillo is a 52 y.o. female.   HPI  52 year old female with a past medical history significant for carotid artery occlusion, PVCs, hypercholesterolemia, anemia, mitral valve regurgitation, hypertension, diverticulitis, and arthritis presents for evaluation of 2 days worth of low back pain with 1 day worth of hematuria.  She has had intermittent bouts of urgency and dysuria.  She also reports that she had 1 episode of yellow diarrhea yesterday but no blood in her stool.  She has had some suprapubic abdominal pain  Past Medical History:  Diagnosis Date   Anemia    Arthritis    lower back   Carotid artery occlusion    Complication of anesthesia    slow to wake (thinks it was after bariatric surgery)   Concussion 11/12/2021   S/P syncopal episode   Hypercholesteremia    Hypertension    Mitral valve regurgitation    Palpitations    PVCs (premature ventricular contractions)     Patient Active Problem List   Diagnosis Date Noted   Carotid stenosis 05/24/2016   Varicose veins of leg with pain, left 05/24/2016    Past Surgical History:  Procedure Laterality Date   CHOLECYSTECTOMY  2011   COLONOSCOPY WITH ESOPHAGOGASTRODUODENOSCOPY (EGD)     2012, 2013, 2014, 2021   LAPAROSCOPIC GASTRIC SLEEVE RESECTION WITH HIATAL HERNIA REPAIR  10/2013   NASAL SEPTOPLASTY W/ TURBINOPLASTY Bilateral 01/18/2022   Procedure: NASAL SEPTOPLASTY WITH SUBMUCOSAL RESECTION OF TURBINATES;  Surgeon: Linus Salmons, MD;  Location: [facility];  Service: ENT;  Laterality: Bilateral;    OB History   No obstetric history on file.      Home Medications    Prior to Admission medications   Medication Sig Start Date End Date Taking? Authorizing Provider  ciprofloxacin (CIPRO) 500 MG tablet Take 1 tablet (500 mg  total) by mouth 2 (two) times daily for 5 days. 11/10/22 11/15/22 Yes Becky Augusta, NP  NON FORMULARY Over the counter estrogen to help with hot flashes   Yes [provider]  phenazopyridine (PYRIDIUM) 200 MG tablet Take 1 tablet (200 mg total) by mouth 3 (three) times daily. 11/10/22  Yes Becky Augusta, NP  ALPRAZolam (NIRAVAM) 1 MG dissolvable tablet alprazolam 1 mg disintegrating tablet  TAKE 1 TABLET BY MOUTH TWICE A DAY AS NEEDED ANXIETY Patient not taking: Reported on 11/10/2022    [provider]  ASPIRIN 81 PO Take by mouth daily.    [provider]  B Complex Vitamins (B COMPLEX PO) Take by mouth daily.    [provider]  meclizine (ANTIVERT) 25 MG tablet Take 25 mg by mouth 3 (three) times daily as needed for dizziness. Patient not taking: Reported on 11/10/2022    [provider]  Melatonin 10 MG TABS Take by mouth.     [provider]  metoprolol succinate (TOPROL-XL) 25 MG 24 hr tablet Take 25 mg by mouth daily.    [provider]  Multiple Vitamin (MULTIVITAMIN) tablet Take 1 tablet by mouth daily.    [provider]  ondansetron (ZOFRAN) 4 MG tablet Take 1 tablet (4 mg total) by mouth every 8 (eight) hours as needed for nausea or vomiting. Patient not taking: Reported on 11/10/2022 01/18/22   Linus Salmons, MD  oxyCODONE-acetaminophen (PERCOCET) 5-325 MG tablet Take 1-2 tablets by mouth every 4 (four) hours as needed for severe pain. Patient not taking: Reported on 11/10/2022 01/18/22 01/18/23  Linus Salmons, MD  Probiotic Product (ALIGN PO) Take by mouth daily.    [provider]  ziprasidone (GEODON) 20 MG capsule Take by mouth.    [provider]    Family History Family History  Problem Relation Age of Onset   Breast cancer Neg Hx     Social History Social History   Tobacco Use   Smoking status: Never   Smokeless tobacco: Never  Vaping Use   Vaping Use: Never used  Substance Use  Topics   Alcohol use: Yes    Comment: rare   Drug use: No     Allergies   Benzonatate, Celecoxib, Etodolac, Hydrocodone-acetaminophen, Hydrocodone-acetaminophen, Oxycodone-acetaminophen, Sulfa antibiotics, Sulfasalazine, and Tramadol hcl   Review of Systems Review of Systems  Constitutional:  Negative for fever.  Gastrointestinal:  Positive for abdominal pain, diarrhea and nausea. Negative for vomiting.       Suprapubic abdominal pain.  Genitourinary:  Positive for dysuria, frequency, hematuria and urgency. Negative for vaginal discharge and vaginal pain.  Musculoskeletal:  Positive for back pain.     Physical Exam Triage Vital Signs ED Triage Vitals  Enc Vitals Group     BP      Pulse      Resp      Temp      Temp src      SpO2      Weight      Height      Head Circumference      Peak Flow      Pain Score      Pain Loc      Pain Edu?      Excl. in GC?    No data found.  Updated Vital Signs BP 113/77 (BP Location: Left Arm) Comment (BP Location): large cuff  Pulse 75   Temp 98.3 F (36.8 C) (Oral)   Resp 20   LMP 05/29/2017   SpO2 96%   Visual Acuity Right Eye Distance:   Left Eye Distance:   Bilateral Distance:    Right Eye Near:   Left Eye Near:    Bilateral Near:     Physical Exam Vitals and nursing note reviewed.  Constitutional:      Appearance: Normal appearance. She is not ill-appearing.  HENT:     Head: Normocephalic and atraumatic.  Cardiovascular:     Rate and Rhythm: Normal rate and regular rhythm.     Pulses: Normal pulses.     Heart sounds: Normal heart sounds. No murmur heard.    No friction rub. No gallop.  Pulmonary:     Effort: Pulmonary effort is normal.     Breath sounds: Normal breath sounds. No wheezing, rhonchi or rales.  Abdominal:     General: Abdomen is flat.     Palpations: Abdomen is soft.     Tenderness: There is abdominal tenderness. There is no right CVA tenderness, left CVA tenderness, guarding or rebound.      Comments: Patient has mild suprapubic abdominal tenderness but no guarding or rebound.  Skin:    General: Skin is warm and dry.     Capillary Refill: Capillary refill takes less than 2 seconds.  Neurological:     General: No focal deficit present.     Mental Status: She is alert and oriented to person, place, and  time.      UC Treatments / Results  Labs (all labs ordered are listed, but only abnormal results are displayed) Labs Reviewed  URINALYSIS, W/ REFLEX TO CULTURE (INFECTION SUSPECTED) - Abnormal; Notable for the following components:      Result Value   Hgb urine dipstick TRACE (*)    Protein, ur 30 (*)    Leukocytes,Ua SMALL (*)    Non Squamous Epithelial PRESENT (*)    Bacteria, UA FEW (*)    All other components within normal limits  URINE CULTURE    EKG   Radiology No results found.  Procedures Procedures (including critical care time)  Medications Ordered in UC Medications - No data to display  Initial Impression / Assessment and Plan / UC Course  I have reviewed the triage vital signs and the nursing notes.  Pertinent labs & imaging results that were available during my care of the patient were reviewed by me and considered in my medical decision making (see chart for details).   The patient is a nontoxic-appearing 52 year old female presenting for evaluation of UTI symptoms as outlined in HPI above.  She reports that her symptoms are similar to her last UTI which was an E. coli UTI in November 2023.  Her exam is relatively benign she does have mild suprapubic tenderness but no CVA tenderness.  Abdomen is otherwise soft.  I will order urinalysis to evaluate for the presence of UTI.  Urinalysis shows trace hemoglobin with 30 protein, small leukocyte esterase but negative for nitrates.  Reflex microscopy shows 11-20 WBCs with few bacteria and mucus present.  Urine will reflex to culture.  I will discharge patient home with diagnosis of UTI and treat her with  Cipro 500 mg twice daily for 5 days given that her last UTI was E. coli.  Also Pyridium to help with urinary discomfort as needed.  Return precautions reviewed.   Final Clinical Impressions(s) / UC Diagnoses   Final diagnoses:  Lower urinary tract infectious disease     Discharge Instructions      Take the Cipro twice daily for 5 days with food for treatment of urinary tract infection.  Use the Pyridium every 8 hours as needed for urinary discomfort.  This will turn your urine a bright red-orange.  Increase your oral fluid intake so that you increase your urine production and or flushing your urinary system.  Take an over-the-counter probiotic, such as Culturelle-Align-Activia, 1 hour after each dose of antibiotic to prevent diarrhea or yeast infections from forming.  We will culture urine and change the antibiotics if necessary.  Return for reevaluation, or see your primary care provider, for any new or worsening symptoms.      ED Prescriptions     Medication Sig Dispense Auth. Provider   ciprofloxacin (CIPRO) 500 MG tablet Take 1 tablet (500 mg total) by mouth 2 (two) times daily for 5 days. 10 tablet Becky Augusta, NP   phenazopyridine (PYRIDIUM) 200 MG tablet Take 1 tablet (200 mg total) by mouth 3 (three) times daily. 6 tablet Becky Augusta, NP      PDMP not reviewed this encounter.   Becky Augusta, NP 11/10/22 1034

## 2022-11-10 NOTE — Discharge Instructions (Addendum)
Take the Cipro twice daily for 5 days with food for treatment of urinary tract infection.  Use the Pyridium every 8 hours as needed for urinary discomfort.  This will turn your urine a bright red-orange.  Increase your oral fluid intake so that you increase your urine production and or flushing your urinary system.  Take an over-the-counter probiotic, such as Culturelle-Align-Activia, 1 hour after each dose of antibiotic to prevent diarrhea or yeast infections from forming.  We will culture urine and change the antibiotics if necessary.  Return for reevaluation, or see your primary care provider, for any new or worsening symptoms.  

## 2022-11-11 LAB — URINE CULTURE: Culture: <10000 — AB

## 2022-11-13 ENCOUNTER — Other Ambulatory Visit: Payer: Self-pay | Admitting: Family Medicine

## 2022-11-13 ENCOUNTER — Ambulatory Visit
Admission: RE | Admit: 2022-11-13 | Discharge: 2022-11-13 | Disposition: A | Discharge status: Home / Self Care | Payer: Managed Care, Other (non HMO) | Source: Ambulatory Visit | Attending: Family Medicine | Admitting: Family Medicine

## 2022-11-13 DIAGNOSIS — R319 Hematuria, unspecified: Secondary | ICD-10-CM

## 2022-11-13 DIAGNOSIS — R109 Unspecified abdominal pain: Secondary | ICD-10-CM

## 2023-03-05 ENCOUNTER — Other Ambulatory Visit: Payer: Self-pay | Admitting: Family Medicine

## 2023-03-05 ENCOUNTER — Ambulatory Visit
Admission: RE | Admit: 2023-03-05 | Discharge: 2023-03-05 | Disposition: A | Discharge status: Home / Self Care | Payer: Managed Care, Other (non HMO) | Source: Ambulatory Visit | Attending: Family Medicine | Admitting: Family Medicine

## 2023-03-05 DIAGNOSIS — D72829 Elevated white blood cell count, unspecified: Secondary | ICD-10-CM

## 2023-03-05 DIAGNOSIS — R1032 Left lower quadrant pain: Secondary | ICD-10-CM

## 2023-03-05 DIAGNOSIS — R112 Nausea with vomiting, unspecified: Secondary | ICD-10-CM

## 2023-03-05 MED ORDER — IOPAMIDOL (ISOVUE-300) INJECTION 61%
100.0000 mL | Freq: Once | INTRAVENOUS | Status: AC | PRN
  Administered 2023-03-05: 100 mL via INTRAVENOUS

## 2023-03-06 ENCOUNTER — Other Ambulatory Visit: Payer: Self-pay | Admitting: Family Medicine

## 2023-03-06 DIAGNOSIS — D72829 Elevated white blood cell count, unspecified: Secondary | ICD-10-CM

## 2023-03-06 DIAGNOSIS — R112 Nausea with vomiting, unspecified: Secondary | ICD-10-CM

## 2023-03-06 DIAGNOSIS — R1032 Left lower quadrant pain: Secondary | ICD-10-CM

## 2023-06-03 ENCOUNTER — Other Ambulatory Visit: Payer: Self-pay

## 2023-06-03 DIAGNOSIS — N390 Urinary tract infection, site not specified: Secondary | ICD-10-CM

## 2023-06-06 ENCOUNTER — Other Ambulatory Visit
Admission: RE | Admit: 2023-06-06 | Discharge: 2023-06-06 | Disposition: A | Discharge status: Home / Self Care | Payer: Managed Care, Other (non HMO) | Attending: Urology | Admitting: Urology

## 2023-06-06 ENCOUNTER — Encounter: Payer: Self-pay | Admitting: Urology

## 2023-06-06 ENCOUNTER — Ambulatory Visit: Payer: Managed Care, Other (non HMO) | Admitting: Urology

## 2023-06-06 VITALS — BP 102/70 | HR 65 | Ht 66.0 in | Wt 206.0 lb

## 2023-06-06 DIAGNOSIS — R319 Hematuria, unspecified: Secondary | ICD-10-CM | POA: Diagnosis not present

## 2023-06-06 DIAGNOSIS — R300 Dysuria: Secondary | ICD-10-CM | POA: Diagnosis not present

## 2023-06-06 DIAGNOSIS — Z87440 Personal history of urinary (tract) infections: Secondary | ICD-10-CM | POA: Diagnosis not present

## 2023-06-06 DIAGNOSIS — N390 Urinary tract infection, site not specified: Secondary | ICD-10-CM | POA: Diagnosis present

## 2023-06-06 LAB — URINALYSIS, COMPLETE (UACMP) WITH MICROSCOPIC
Bilirubin Urine: NEGATIVE
Glucose, UA: NEGATIVE mg/dL
Hgb urine dipstick: NEGATIVE
Ketones, ur: NEGATIVE mg/dL
Nitrite: NEGATIVE
Protein, ur: NEGATIVE mg/dL
Specific Gravity, Urine: 1.02 (ref 1.005–1.030)
pH: 6.5 (ref 5.0–8.0)

## 2023-06-06 MED ORDER — ESTRADIOL 0.1 MG/GM VA CREA
TOPICAL_CREAM | VAGINAL | 12 refills | Status: AC
Start: 2023-06-06 — End: ?

## 2023-06-06 NOTE — Progress Notes (Signed)
I,Amy L Pierron,acting as a scribe for Vanna Scotland, MD.,have documented all relevant documentation on the behalf of Vanna Scotland, MD,as directed by  Vanna Scotland, MD while in the presence of Vanna Scotland, MD.  06/06/2023 7:19 PM   Cheryl Murillo July 06, 1970 [id number]  Referring provider: Marguarite Arbour, MD [address] [facility] [place],  [place] [address]  Chief Complaint  Patient presents with   Recurrent UTI    HPI: 53 year-old female presents today as a self-referral for further evaluation of recurrent urinay tract infections.  She's a patient of Dr. Judithann Sheen. She has multiple urinalysis over the past calendar year, including March of 2024 that showed a few WBC's. One in June of 2024 which had a similar appearance and with associated negative urine culture. Urinalysis in October 2024, with 33 WBCs but appeared to be contaminated sample as well as epitheliales. But most recently on 05/06/2023, nitrate positive with many RBCs, WBCs, and moderate bacteria. On this last occasion, she was symptomatic. She was started on Keflex and later transitioned to Cipro. All of her urine cultures have been negative or with mixed flora.  Her last imaging was in the form of CT abdomen pelvis with contrast on 03/05/2023 that showed no evidence of any GU pathology. At the time she had sigmoid diverticulitis.  She mentions starting to have regular UTI's as a young adult. She had a procedure done to stretch her urethra and didn't have as many after that.  Now for the past 6 months she feels symptoms of a UTI and noticed blood mixed in with her urine. She also had burning while she was peeing. She wears a panty liner due to having spasms with leakage and she noted some spots of blood on it also.   She has Cipro at home and when the symptoms start she takes one and then she gives her urine sample. She notices that symptoms tend to occur after bouts of intercourse.  Results for  orders placed or performed during the hospital encounter of 06/06/23  Urinalysis, Complete w Microscopic -  Result Value Ref Range   Color, Urine YELLOW YELLOW   APPearance CLEAR CLEAR   Specific Gravity, Urine 1.020 1.005 - 1.030   pH 6.5 5.0 - 8.0   Glucose, UA NEGATIVE NEGATIVE mg/dL   Hgb urine dipstick NEGATIVE NEGATIVE   Bilirubin Urine NEGATIVE NEGATIVE   Ketones, ur NEGATIVE NEGATIVE mg/dL   Protein, ur NEGATIVE NEGATIVE mg/dL   Nitrite NEGATIVE NEGATIVE   Leukocytes,Ua SMALL (A) NEGATIVE   Squamous Epithelial / HPF 6-10 0 - 5 /HPF   Non Squamous Epithelial PRESENT (A) NONE SEEN   WBC, UA 6-10 0 - 5 WBC/hpf   RBC / HPF 0-5 0 - 5 RBC/hpf   Bacteria, UA FEW (A) NONE SEEN   WBC Clumps PRESENT   PVR today is 0.   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   NASAL SEPTOPLASTY W/ TURBINOPLASTY Bilateral 01/18/2022   Procedure: NASAL SEPTOPLASTY WITH SUBMUCOSAL RESECTION OF TURBINATES;  Surgeon: Linus Salmons, MD;  Location: [facility];  Service: ENT;  Laterality: Bilateral;    Home Medications:  Allergies as of 06/06/2023       Reactions   Celecoxib Diarrhea, Hives   Benzonatate Diarrhea, Nausea Only, Nausea And Vomiting   Etodolac Other (See Comments)   Stomach irritation   Hydrocodone-acetaminophen Itching   Hydrocodone-acetaminophen     Oxycodone-acetaminophen Itching   Sulfa Antibiotics Itching   Has taken bactrim with no problems   Sulfasalazine Itching   Tramadol    Other Reaction(s): Not available   Tramadol Hcl    Jittery        Medication List        Accurate as of June 06, 2023  7:19 PM. If you have any questions, ask your nurse or doctor.          STOP taking these medications    ALIGN PO Stopped by: Vanna Scotland   ALPRAZolam 1 MG dissolvable tablet Commonly known as: NIRAVAM Stopped by: Vanna Scotland   meclizine 25 MG tablet Commonly known as: ANTIVERT Stopped by: Vanna Scotland   Melatonin 10 MG Tabs Stopped by: Vanna Scotland   ondansetron 4 MG tablet Commonly known as: Zofran Stopped by: Vanna Scotland   phenazopyridine 200 MG tablet Commonly known as: PYRIDIUM Stopped by: Vanna Scotland       TAKE these medications    amphetamine-dextroamphetamine 10 MG tablet Commonly known as: ADDERALL Take 10 mg by mouth daily with breakfast.   ASPIRIN 81 PO Take by mouth daily.   AZO CRANBERRY PO Take by mouth daily.   B COMPLEX PO Take by mouth daily.   clobetasol 0.05 % external solution Commonly known as: TEMOVATE Apply 1 Application topically.   estradiol 0.1 MG/GM vaginal cream Commonly known as: ESTRACE Estrogen Cream Instruction Discard applicator Apply pea sized amount to tip of finger to urethra before bed. Wash hands well after application. Use Monday, Wednesday and Friday Started by: Vanna Scotland   hyoscyamine 0.125 MG SL tablet Commonly known as: LEVSIN SL Take 0.125 mg by mouth every 6 (six) hours as needed.   ketoconazole 2 % shampoo Commonly known as: NIZORAL 1 Application 2 (two) times a week.   metFORMIN 500 MG tablet Commonly known as: GLUCOPHAGE Take 500 mg by mouth 2 (two) times daily with a meal.   metoprolol succinate 25 MG 24 hr tablet Commonly known as: TOPROL-XL Take 25 mg by mouth daily.   multivitamin tablet Take 1 tablet by  mouth daily.   NON FORMULARY Over the counter estrogen to help with hot flashes   Vitamin D (Ergocalciferol) 1.25 MG (50000 UNIT) Caps capsule Commonly known as: DRISDOL Take 50,000 Units by mouth once a week.   Zepbound 7.5 MG/0.5ML Pen Generic drug: tirzepatide Inject 7.5 mg into the skin once a week.   ziprasidone 20 MG capsule Commonly known as: GEODON Take by mouth.        Allergies:  Allergies  Allergen Reactions   Celecoxib Diarrhea and Hives   Benzonatate Diarrhea, Nausea Only and Nausea And Vomiting   Etodolac Other (See Comments)    Stomach irritation   Hydrocodone-Acetaminophen Itching   Hydrocodone-Acetaminophen    Oxycodone-Acetaminophen Itching   Sulfa Antibiotics Itching    Has  taken bactrim with no problems   Sulfasalazine Itching   Tramadol     Other Reaction(s): Not available   Tramadol Hcl     Jittery    Family History: Family History  Problem Relation Age of Onset   Breast cancer Neg Hx     Social History:  reports that she has never smoked. She has never used smokeless tobacco. She reports current alcohol use. She reports that she does not use drugs.   Physical Exam: BP 102/70 (BP Location: Left Arm, Patient Position: Sitting, Cuff Size: Large)   Pulse 65   Ht 5\' 6"  (1.676 m)   Wt 206 lb (93.4 kg)   LMP 05/29/2017   SpO2 99%   BMI 33.25 kg/m   Constitutional:  Alert and oriented, No acute distress. HEENT: Cheryl Murillo AT, moist mucus membranes.  Trachea midline, no masses. Neurologic: Grossly intact, no focal deficits, moving all 4 extremities. Psychiatric: Normal mood and affect.  Urinalysis    Component Value Date/Time   COLORURINE YELLOW 06/06/2023 0833   APPEARANCEUR CLEAR 06/06/2023 0833   LABSPEC 1.020 06/06/2023 0833   PHURINE 6.5 06/06/2023 0833   GLUCOSEU NEGATIVE 06/06/2023 0833   HGBUR NEGATIVE 06/06/2023 0833   BILIRUBINUR NEGATIVE 06/06/2023 0833   KETONESUR NEGATIVE 06/06/2023 0833   PROTEINUR NEGATIVE 06/06/2023 0833    NITRITE NEGATIVE 06/06/2023 0833   LEUKOCYTESUR SMALL (A) 06/06/2023 0833    Lab Results  Component Value Date   BACTERIA FEW (A) 06/06/2023    Pertinent Imaging: Narrative & Impression  CLINICAL DATA:  Left lower quadrant abdominal pain with nausea and vomiting.   EXAM: CT ABDOMEN AND PELVIS WITH CONTRAST   TECHNIQUE: Multidetector CT imaging of the abdomen and pelvis was performed using the standard protocol following bolus administration of intravenous contrast.   RADIATION DOSE REDUCTION: This exam was performed according to the departmental dose-optimization program which includes automated exposure control, adjustment of the mA and/or kV according to patient size and/or use of iterative reconstruction technique.   CONTRAST:  ISOVUE-300 IOPAMIDOL (ISOVUE-300) INJECTION 61%   COMPARISON:  CT abdomen pelvis dated November 13, 2022.   FINDINGS: Lower chest: No acute abnormality.   Hepatobiliary: No focal liver abnormality is seen. Status post cholecystectomy. No biliary dilatation.   Pancreas: Unremarkable. No pancreatic ductal dilatation or surrounding inflammatory changes.   Spleen: Normal in size without focal abnormality.   Adrenals/Urinary Tract: Adrenal glands are unremarkable. Kidneys are normal, without renal calculi, focal lesion, or hydronephrosis. Bladder is unremarkable.   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 Normal small bowel. Severe wall thickening of the proximal to mid sigmoid colon adjacent to an inflamed diverticulum. No extraluminal air or fluid collection. The tip of the appendix is fluid-filled and borderline dilated, measuring 8 mm in diameter, located at the superior margin of the sigmoid colon inflammation and therefore likely reactive. The rest of the appendix is non-dilated and filled with air. No appendicolith.   Vascular/Lymphatic: No significant vascular findings are present. No enlarged  abdominal or pelvic lymph nodes.   Reproductive: Uterus and bilateral adnexa are unremarkable.   Other: Small amount of free fluid in the pelvis. No pneumoperitoneum.   Musculoskeletal: No acute or significant osseous findings.   IMPRESSION: 1. Acute uncomplicated sigmoid diverticulitis.   Electronically Signed   By: Obie Dredge M.D.   On: 03/05/2023 16:19  Personally reviewed the above scan and agree with radiologic interpretation.    Assessment & Plan:    1. Presumed recurrent  UTI  - Her symptoms are consistent with this. She's been self medicating with Cipro before her urine culture, so it's unclear whether or not this is the true cause. Certainly her symptoms correlate with this, and she does have a personal history of this.   - Recommend a cath UA next time she has any symptoms to rule out microscopic hematuria and assess the need for hematuria evaluation.   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 Will treat her  presumptively with topical estrogen cream for both of these issues.   - Encouraged sufficient hydration, cranberry tablets, probiotics, and D-mannose.  Return if symptoms worsen or fail to improve.   [facility] [address] [place], [place] [address] [phone]

## 2023-06-24 ENCOUNTER — Ambulatory Visit: Payer: Managed Care, Other (non HMO) | Admitting: Physician Assistant

## 2023-06-24 VITALS — BP 121/79 | HR 86

## 2023-06-24 DIAGNOSIS — R8281 Pyuria: Secondary | ICD-10-CM

## 2023-06-24 DIAGNOSIS — N390 Urinary tract infection, site not specified: Secondary | ICD-10-CM

## 2023-06-24 DIAGNOSIS — Z87440 Personal history of urinary (tract) infections: Secondary | ICD-10-CM | POA: Diagnosis not present

## 2023-06-24 LAB — URINALYSIS, COMPLETE
Bilirubin, UA: NEGATIVE
Glucose, UA: NEGATIVE
Ketones, UA: NEGATIVE
Nitrite, UA: NEGATIVE
Protein,UA: NEGATIVE
RBC, UA: NEGATIVE
Specific Gravity, UA: 1.02 (ref 1.005–1.030)
Urobilinogen, Ur: 0.2 mg/dL (ref 0.2–1.0)
pH, UA: 6 (ref 5.0–7.5)

## 2023-06-24 LAB — MICROSCOPIC EXAMINATION

## 2023-06-24 MED ORDER — FLUCONAZOLE 150 MG PO TABS: 150.0000 mg | ORAL_TABLET | Freq: Once | ORAL | 0 refills | Status: AC

## 2023-06-24 MED ORDER — DOXYCYCLINE HYCLATE 100 MG PO CAPS: 100.0000 mg | ORAL_CAPSULE | Freq: Two times a day (BID) | ORAL | 0 refills | Status: AC

## 2023-06-24 NOTE — Progress Notes (Signed)
06/24/2023 2:45 PM   [name] 04/21/1971 [id number]  CC: Chief Complaint  Patient presents with   Follow-up    HPI: Cheryl Murillo is a 53 y.o. female with PMH presumed recurrent UTI who presents today for evaluation of possible UTI.   Today she reports a 1 day history of dysuria, urgency, frequency, bladder spasm, and gross hematuria that has resolved.  She has not taken any medications for this.  She has been using topical vaginal estrogen cream 1-2 times per week.  She showers twice a day with only sensitive skin soap.  She is on cranberry supplements and has purchased some d-mannose, but not started it yet.  She wonders what else she can do for UTI prevention.  In-office catheterized UA today positive for trace leukocytes; urine microscopy with 11-30 WBCs/HPF.  PMH: Past Medical History:  Diagnosis Date   Anemia    Arthritis    lower back   Carotid artery occlusion    Complication of anesthesia    slow to wake (thinks it was after bariatric surgery)   Concussion 11/12/2021   S/P syncopal episode   Hypercholesteremia    Hypertension    Mitral valve regurgitation    Palpitations    PVCs (premature ventricular contractions)    Urinary tract infection     Surgical History: Past Surgical History:  Procedure Laterality Date   CHOLECYSTECTOMY  2011   COLONOSCOPY WITH ESOPHAGOGASTRODUODENOSCOPY (EGD)     2012, 2013, 2014, 2021   LAPAROSCOPIC GASTRIC SLEEVE RESECTION WITH HIATAL HERNIA REPAIR  10/2013   NASAL SEPTOPLASTY W/ TURBINOPLASTY Bilateral 01/18/2022   Procedure: NASAL SEPTOPLASTY WITH SUBMUCOSAL RESECTION OF TURBINATES;  Surgeon: Linus Salmons, MD;  Location: [facility];  Service: ENT;  Laterality: Bilateral;    Home Medications:  Allergies as of 06/24/2023       Reactions   Celecoxib Diarrhea, Hives   Benzonatate Diarrhea, Nausea Only, Nausea And Vomiting   Etodolac Other (See Comments)   Stomach irritation    Hydrocodone-acetaminophen Itching   Hydrocodone-acetaminophen    Oxycodone-acetaminophen Itching   Sulfa Antibiotics Itching   Has taken bactrim with no problems   Sulfasalazine Itching   Tramadol    Other Reaction(s): Not available   Tramadol Hcl    Jittery        Medication List        Accurate as of June 24, 2023  2:45 PM. If you have any questions, ask your nurse or doctor.          STOP taking these medications    amphetamine-dextroamphetamine 10 MG tablet Commonly known as: ADDERALL   clobetasol 0.05 % external solution Commonly known as: TEMOVATE   hyoscyamine 0.125 MG SL tablet Commonly known as: LEVSIN SL   ketoconazole 2 % shampoo Commonly known as: NIZORAL   metFORMIN 500 MG tablet Commonly known as: GLUCOPHAGE       TAKE these medications    ASPIRIN 81 PO Take by mouth daily.   AZO CRANBERRY PO Take by mouth daily.   B COMPLEX PO Take by mouth daily.   doxycycline 100 MG capsule Commonly known as: VIBRAMYCIN Take 1 capsule (100 mg total) by mouth 2 (two) times daily for 7 days.   estradiol 0.1 MG/GM vaginal cream Commonly known as: ESTRACE Estrogen Cream Instruction Discard applicator Apply pea sized amount to tip of finger to urethra before bed. Wash hands well after application. Use Monday, Wednesday and Friday   fluconazole 150 MG tablet Commonly  known as: DIFLUCAN Take 1 tablet (150 mg total) by mouth once for 1 dose.   metoprolol succinate 25 MG 24 hr tablet Commonly known as: TOPROL-XL Take 25 mg by mouth daily.   multivitamin tablet Take 1 tablet by mouth daily.   NON FORMULARY Over the counter estrogen to help with hot flashes   Vitamin D (Ergocalciferol) 1.25 MG (50000 UNIT) Caps capsule Commonly known as: DRISDOL Take 50,000 Units by mouth once a week.   Zepbound 7.5 MG/0.5ML Pen Generic drug: tirzepatide Inject 7.5 mg into the skin once a week.   ziprasidone 20 MG capsule Commonly known as: GEODON Take  by mouth.        Allergies:  Allergies  Allergen Reactions   Celecoxib Diarrhea and Hives   Benzonatate Diarrhea, Nausea Only and Nausea And Vomiting   Etodolac Other (See Comments)    Stomach irritation   Hydrocodone-Acetaminophen Itching   Hydrocodone-Acetaminophen    Oxycodone-Acetaminophen Itching   Sulfa Antibiotics Itching    Has taken bactrim with no problems   Sulfasalazine Itching   Tramadol     Other Reaction(s): Not available   Tramadol Hcl     Jittery    Family History: Family History  Problem Relation Age of Onset   Breast cancer Neg Hx     Social History:   reports that she has never smoked. She has never used smokeless tobacco. She reports current alcohol use. She reports that she does not use drugs.  Physical Exam: BP 121/79   Pulse 86   LMP 05/29/2017   Constitutional:  Alert and oriented, no acute distress, nontoxic appearing HEENT: [place], AT Cardiovascular: No clubbing, cyanosis, or edema Respiratory: Normal respiratory effort, no increased work of breathing Skin: No rashes, bruises or suspicious lesions Neurologic: Grossly intact, no focal deficits, moving all 4 extremities Psychiatric: Normal mood and affect  Laboratory Data: Results for orders placed or performed in visit on 06/24/23  Microscopic Examination   Collection Time: 06/24/23  2:05 PM   Urine  Result Value Ref Range   WBC, UA 11-30 (A) 0 - 5 /hpf   RBC, Urine 0-2 0 - 2 /hpf   Epithelial Cells (non renal) 0-10 0 - 10 /hpf   Mucus, UA Present (A) Not Estab.   Bacteria, UA Few None seen/Few  Urinalysis, Complete   Collection Time: 06/24/23  2:05 PM  Result Value Ref Range   Specific Gravity, UA 1.020 1.005 - 1.030   pH, UA 6.0 5.0 - 7.5   Color, UA Yellow Yellow   Appearance Ur Clear Clear   Leukocytes,UA Trace (A) Negative   Protein,UA Negative Negative/Trace   Glucose, UA Negative Negative   Ketones, UA Negative Negative   RBC, UA Negative Negative   Bilirubin, UA  Negative Negative   Urobilinogen, Ur 0.2 0.2 - 1.0 mg/dL   Nitrite, UA Negative Negative   Microscopic Examination See below:    Assessment & Plan:   1. Recurrent UTI (Primary) Cath UA today notable for pyuria.  Will start empiric Doxy and send for standard and atypical cultures.  I am also sending in Diflucan per patient request.  If her cultures are negative, I recommend at least a cystoscopy for further evaluation.  We reviewed UTI prevention strategies including estrogen cream, cranberry supplements, d-mannose supplements, and lactobacillus containing probiotics.  I gave her my UTI prevention info in her AVS today. - Urinalysis, Complete - CULTURE, URINE COMPREHENSIVE - Mycoplasma / ureaplasma culture - doxycycline (VIBRAMYCIN) 100 MG capsule;  Take 1 capsule (100 mg total) by mouth 2 (two) times daily for 7 days.  Dispense: 14 capsule; Refill: 0 - fluconazole (DIFLUCAN) 150 MG tablet; Take 1 tablet (150 mg total) by mouth once for 1 dose.  Dispense: 1 tablet; Refill: 0  Return if symptoms worsen or fail to improve.  Carman Ching, PA-C  [facility] [address], [address] [place], [place] [address] [phone]

## 2023-06-24 NOTE — Patient Instructions (Signed)
Recurrent UTI Prevention Strategies  Stay well hydrated. Continue taking an over-the-counter cranberry supplement for urinary tract health. Take this once or twice daily on an empty stomach, e.g. right before bed. Start taking an over-the-counter d-mannose supplement. Take this daily per packaging instructions. Start taking an over-the-counter probiotic containing the bacterial species called Lactobacillus. Take this daily. Continue vaginal estrogen cream. Apply a pea-sized amount around the opening of the urethra every day for 2 weeks, then three times weekly forever.

## 2023-06-27 LAB — CULTURE, URINE COMPREHENSIVE

## 2023-07-01 LAB — MYCOPLASMA / UREAPLASMA CULTURE
Mycoplasma hominis Culture: NEGATIVE
Ureaplasma urealyticum: NEGATIVE

## 2023-07-03 NOTE — Telephone Encounter (Signed)
 Cysto scheduled, pt aware.

## 2023-07-22 ENCOUNTER — Encounter: Payer: Self-pay | Admitting: Physician Assistant

## 2023-07-22 ENCOUNTER — Ambulatory Visit: Admitting: Physician Assistant

## 2023-07-22 VITALS — BP 121/79 | HR 76 | Ht 66.0 in | Wt 203.0 lb

## 2023-07-22 DIAGNOSIS — N390 Urinary tract infection, site not specified: Secondary | ICD-10-CM

## 2023-07-22 LAB — MICROSCOPIC EXAMINATION

## 2023-07-22 LAB — URINALYSIS, COMPLETE
Bilirubin, UA: NEGATIVE
Glucose, UA: NEGATIVE
Ketones, UA: NEGATIVE
Nitrite, UA: NEGATIVE
Protein,UA: NEGATIVE
Specific Gravity, UA: <1.005 — ABNORMAL LOW (ref 1.005–1.030)
Urobilinogen, Ur: 0.2 mg/dL (ref 0.2–1.0)
pH, UA: 5.5 (ref 5.0–7.5)

## 2023-07-22 MED ORDER — NITROFURANTOIN MONOHYD MACRO 100 MG PO CAPS: 100.0000 mg | ORAL_CAPSULE | Freq: Two times a day (BID) | ORAL | 0 refills | Status: AC

## 2023-07-22 MED ORDER — NITROFURANTOIN MONOHYD MACRO 100 MG PO CAPS: 100.0000 mg | ORAL_CAPSULE | ORAL | 2 refills | Status: AC | PRN

## 2023-07-22 NOTE — Progress Notes (Signed)
 07/22/2023 2:18 PM   Cheryl Murillo [birth year] [id number]  CC: Chief Complaint  Patient presents with   Urinary Tract Infection   HPI: Cheryl Murillo is a 53 y.o. female with PMH recurrent UTI who presents today for evaluation of possible UTI.   Today she reports sudden onset frequency, urgency, and dysuria earlier this morning.  She denies fever, chills, nausea, or vomiting.  She continues to use topical vaginal estrogen cream 3 times weekly.  She is taking baths twice a day.  She has noticed some association of her UTIs with sex and wonders about postcoital prophylaxis.  She is scheduled for cystoscopy later this month.  In-office UA today positive for 3+ blood and 1+ leukocytes; urine microscopy with 11-30 WBCs/HPF, 11-30 RBCs/HPF, and few bacteria.  PMH: Past Medical History:  Diagnosis Date   Anemia    Arthritis    lower back   Carotid artery occlusion    Complication of anesthesia    slow to wake (thinks it was after bariatric surgery)   Concussion 11/12/2021   S/P syncopal episode   Hypercholesteremia    Hypertension    Mitral valve regurgitation    Palpitations    PVCs (premature ventricular contractions)    Urinary tract infection     Surgical History: Past Surgical History:  Procedure Laterality Date   CHOLECYSTECTOMY  2011   COLONOSCOPY WITH ESOPHAGOGASTRODUODENOSCOPY (EGD)     2012, 2013, 2014, 2021   LAPAROSCOPIC GASTRIC SLEEVE RESECTION WITH HIATAL HERNIA REPAIR  10/2013   NASAL SEPTOPLASTY W/ TURBINOPLASTY Bilateral 01/18/2022   Procedure: NASAL SEPTOPLASTY WITH SUBMUCOSAL RESECTION OF TURBINATES;  Surgeon: Linus Salmons, MD;  Location: [facility];  Service: ENT;  Laterality: Bilateral;    Home Medications:  Allergies as of 07/22/2023       Reactions   Celecoxib Diarrhea, Hives   Benzonatate Diarrhea, Nausea Only, Nausea And Vomiting   Etodolac Other (See Comments)   Stomach irritation   Hydrocodone-acetaminophen Itching    Hydrocodone-acetaminophen    Oxycodone-acetaminophen Itching   Sulfa Antibiotics Itching   Has taken bactrim with no problems   Sulfasalazine Itching   Tramadol    Other Reaction(s): Not available   Tramadol Hcl    Jittery        Medication List        Accurate as of July 22, 2023  2:18 PM. If you have any questions, ask your nurse or doctor.          STOP taking these medications    Zepbound 7.5 MG/0.5ML Pen Generic drug: tirzepatide Stopped by: Cheryl Murillo       TAKE these medications    ASPIRIN 81 PO Take by mouth daily.   AZO CRANBERRY PO Take by mouth daily.   B COMPLEX PO Take by mouth daily.   estradiol 0.1 MG/GM vaginal cream Commonly known as: ESTRACE Estrogen Cream Instruction Discard applicator Apply pea sized amount to tip of finger to urethra before bed. Wash hands well after application. Use Monday, Wednesday and Friday   metoprolol succinate 25 MG 24 hr tablet Commonly known as: TOPROL-XL Take 25 mg by mouth daily.   multivitamin tablet Take 1 tablet by mouth daily.   nitrofurantoin (macrocrystal-monohydrate) 100 MG capsule Commonly known as: MACROBID Take 1 capsule (100 mg total) by mouth 2 (two) times daily for 5 days. Started by: Cheryl Murillo   nitrofurantoin (macrocrystal-monohydrate) 100 MG capsule Commonly known as: MACROBID Take 1 capsule (100 mg total) by mouth as  needed (around the time of sex). Started by: Cheryl Murillo   NON FORMULARY Over the counter estrogen to help with hot flashes   Vitamin D (Ergocalciferol) 1.25 MG (50000 UNIT) Caps capsule Commonly known as: DRISDOL Take 50,000 Units by mouth once a week.   ziprasidone 20 MG capsule Commonly known as: GEODON Take by mouth.        Allergies:  Allergies  Allergen Reactions   Celecoxib Diarrhea and Hives   Benzonatate Diarrhea, Nausea Only and Nausea And Vomiting   Etodolac Other (See Comments)    Stomach irritation    Hydrocodone-Acetaminophen Itching   Hydrocodone-Acetaminophen    Oxycodone-Acetaminophen Itching   Sulfa Antibiotics Itching    Has taken bactrim with no problems   Sulfasalazine Itching   Tramadol     Other Reaction(s): Not available   Tramadol Hcl     Jittery    Family History: Family History  Problem Relation Age of Onset   Breast cancer Neg Hx     Social History:   reports that she has never smoked. She has never used smokeless tobacco. She reports current alcohol use. She reports that she does not use drugs.  Physical Exam: BP 121/79   Pulse 76   Ht 5\' 6"  (1.676 m)   Wt 203 lb (92.1 kg)   LMP 05/29/2017   BMI 32.77 kg/m   Constitutional:  Alert and oriented, no acute distress, nontoxic appearing HEENT: [place], AT Cardiovascular: No clubbing, cyanosis, or edema Respiratory: Normal respiratory effort, no increased work of breathing Skin: No rashes, bruises or suspicious lesions Neurologic: Grossly intact, no focal deficits, moving all 4 extremities Psychiatric: Normal mood and affect  Laboratory Data: Results for orders placed or performed in visit on 07/22/23  Microscopic Examination   Collection Time: 07/22/23  1:58 PM   Urine  Result Value Ref Range   WBC, UA 11-30 (A) 0 - 5 /hpf   RBC, Urine 11-30 (A) 0 - 2 /hpf   Epithelial Cells (non renal) 0-10 0 - 10 /hpf   Bacteria, UA Few None seen/Few  Urinalysis, Complete   Collection Time: 07/22/23  1:58 PM  Result Value Ref Range   Specific Gravity, UA <1.005 (L) 1.005 - 1.030   pH, UA 5.5 5.0 - 7.5   Color, UA Yellow Yellow   Appearance Ur Hazy (A) Clear   Leukocytes,UA 1+ (A) Negative   Protein,UA Negative Negative/Trace   Glucose, UA Negative Negative   Ketones, UA Negative Negative   RBC, UA 3+ (A) Negative   Bilirubin, UA Negative Negative   Urobilinogen, Ur 0.2 0.2 - 1.0 mg/dL   Nitrite, UA Negative Negative   Microscopic Examination See below:    Assessment & Plan:   1. Recurrent UTI (Primary) UA  positive, will start empiric Macrobid and send for culture for further evaluation.  Agree with postcoital prophylaxis, will send in Macrobid for this as well.  We discussed not taking more than 2 doses in a 24-hour period.  Will keep cystoscopy as planned. - Urinalysis, Complete - CULTURE, URINE COMPREHENSIVE - nitrofurantoin, macrocrystal-monohydrate, (MACROBID) 100 MG capsule; Take 1 capsule (100 mg total) by mouth 2 (two) times daily for 5 days.  Dispense: 10 capsule; Refill: 0 - nitrofurantoin, macrocrystal-monohydrate, (MACROBID) 100 MG capsule; Take 1 capsule (100 mg total) by mouth as needed (around the time of sex).  Dispense: 30 capsule; Refill: 2  Return for Keep follow-up as scheduled.  Cheryl Ching, PA-C  [facility] [place] [address]  [address], [address] [place], [place] [address] [phone]

## 2023-07-26 LAB — CULTURE, URINE COMPREHENSIVE

## 2023-08-01 ENCOUNTER — Other Ambulatory Visit: Payer: Self-pay

## 2023-08-01 DIAGNOSIS — N390 Urinary tract infection, site not specified: Secondary | ICD-10-CM

## 2023-08-08 ENCOUNTER — Ambulatory Visit: Payer: Managed Care, Other (non HMO) | Admitting: Urology

## 2023-08-08 ENCOUNTER — Other Ambulatory Visit
Admission: RE | Admit: 2023-08-08 | Discharge: 2023-08-08 | Disposition: A | Discharge status: Home / Self Care | Attending: Urology | Admitting: Urology

## 2023-08-08 VITALS — BP 115/82 | HR 71 | Ht 67.0 in | Wt 212.2 lb

## 2023-08-08 DIAGNOSIS — N390 Urinary tract infection, site not specified: Secondary | ICD-10-CM | POA: Insufficient documentation

## 2023-08-08 LAB — URINALYSIS, COMPLETE (UACMP) WITH MICROSCOPIC
Bilirubin Urine: NEGATIVE
Glucose, UA: NEGATIVE mg/dL
Hgb urine dipstick: NEGATIVE
Ketones, ur: NEGATIVE mg/dL
Nitrite: NEGATIVE
Protein, ur: NEGATIVE mg/dL
Specific Gravity, Urine: 1.02 (ref 1.005–1.030)
pH: 7 (ref 5.0–8.0)

## 2023-08-08 NOTE — Progress Notes (Signed)
   08/08/23  CC:  Chief Complaint  Patient presents with   Cysto    HPI: 53 year old female who presents today for further evaluation of recurrent UTIs.  She is now using topical estrogen cream and using Macrobid as needed with sexual intercourse for prophylaxis.  She is asymptomatic today.  Blood pressure 115/82, pulse 71, height 5\' 7"  (1.702 m), weight 212 lb 4 oz (96.3 kg), last menstrual period 05/29/2017. NED. A&Ox3.   No respiratory distress   Abd soft, NT, ND Normal external genitalia with patent urethral meatus  Cystoscopy Procedure Note  Patient identification was confirmed, informed consent was obtained, and patient was prepped using Betadine solution.  Lidocaine jelly was administered per urethral meatus.    Procedure: - Flexible cystoscope introduced, without any difficulty.   - Thorough search of the bladder revealed:    normal urethral meatus    normal urothelium    no stones    no ulcers     no tumors    no urethral polyps    no trabeculation Urine is mildly cloudy  - Ureteral orifices were normal in position and appearance.  Post-Procedure: - Patient tolerated the procedure well  Assessment/ Plan:  1. Recurrent UTI (Primary) Bladder appears, reassuring  Continue to use supplements and topical estrogen cream and prophylactic Macrobid as needed.  Return if she develops UTI symptoms    Vanna Scotland, MD

## 2023-09-25 ENCOUNTER — Ambulatory Visit (INDEPENDENT_AMBULATORY_CARE_PROVIDER_SITE_OTHER): Admitting: Adult Health

## 2023-09-25 ENCOUNTER — Encounter (INDEPENDENT_AMBULATORY_CARE_PROVIDER_SITE_OTHER): Payer: Self-pay | Admitting: Adult Health

## 2023-09-25 VITALS — BP 130/83 | HR 96 | Temp 98.6°F | Ht 66.0 in | Wt 212.0 lb

## 2023-09-25 DIAGNOSIS — E669 Obesity, unspecified: Secondary | ICD-10-CM | POA: Diagnosis not present

## 2023-09-25 DIAGNOSIS — Z0289 Encounter for other administrative examinations: Secondary | ICD-10-CM

## 2023-09-25 DIAGNOSIS — Z0000 Encounter for general adult medical examination without abnormal findings: Secondary | ICD-10-CM

## 2023-09-25 DIAGNOSIS — Z6834 Body mass index (BMI) 34.0-34.9, adult: Secondary | ICD-10-CM | POA: Diagnosis not present

## 2023-09-25 DIAGNOSIS — E559 Vitamin D deficiency, unspecified: Secondary | ICD-10-CM

## 2023-09-25 DIAGNOSIS — I10 Essential (primary) hypertension: Secondary | ICD-10-CM

## 2023-09-27 NOTE — Progress Notes (Signed)
 Office: [phone]  /  Fax: [phone]   Initial Visit  Cheryl Murillo was seen in clinic today to evaluate for obesity. She is interested in losing weight to improve overall health and reduce the risk of weight related complications. She presents today to review program treatment options, initial physical assessment, and evaluation.     Discussed the use of AI scribe software for clinical note transcription with the patient, who gave verbal consent to proceed.  History of Present Illness          She was referred by: Friend or Family  When asked what else they would like to accomplish? She states: Adopt healthier eating patterns, Improve energy levels and physical activity, Improve quality of life, Improve appearance, and Current weight 212 lbs with ultimate goal weight 160-172 lbs.  When asked how has your weight affected you? She states: Contributed to medical problems, Contributed to orthopedic problems or mobility issues, Having fatigue, Having poor endurance, and Problems with eating patterns  Weight history: Lifelong challenge with elevated BMI  Some associated conditions: Hypertension and Vitamin D Deficiency  Contributing factors: Consumption of processed foods, Use of obesogenic medications: Beta-blockers and Psychotropic medications, Moderate to high levels of stress, Reduced physical activity, and Eating patterns  Weight promoting medications identified: Beta-blockers and Psychotropic medications  Current nutrition plan: None  Current level of physical activity: None  Current or previous pharmacotherapy: Phentermine and GLP-1 + GIP  Response to medication: Phentemine: Tachycardia Zepbound: GI upset- unable to tolerate either    Past medical history includes:   Past Medical History:  Diagnosis Date   Anemia    Arthritis    lower back   Carotid artery occlusion    Complication of anesthesia    slow to wake (thinks it was after bariatric surgery)    Concussion 11/12/2021   S/P syncopal episode   Hypercholesteremia    Hypertension    Mitral valve regurgitation    Palpitations    PVCs (premature ventricular contractions)    Urinary tract infection      Objective:   BP 130/83   Pulse 96   Temp 98.6 F (37 C)   Ht 5\' 6"  (1.676 m)   Wt 212 lb (96.2 kg)   LMP 05/29/2017   SpO2 97%   BMI 34.22 kg/m  She was weighed on the bioimpedance scale: Body mass index is 34.22 kg/m.  Peak Weight:279 , Body Fat%:43, Visceral Fat Rating:11, Weight trend over the last 12 months: Increasing  General:  Alert, oriented and cooperative. Patient is in no acute distress.  Respiratory: Normal respiratory effort, no problems with respiration noted   Gait: able to ambulate independently  Mental Status: Normal mood and affect. Normal behavior. Normal judgment and thought content.   DIAGNOSTIC DATA REVIEWED:  BMET    Component Value Date/Time   NA 137 11/10/2013 0516   K 4.3 11/10/2013 0516   CL 103 11/10/2013 0516   CO2 29 11/10/2013 0516   GLUCOSE 150 (H) 11/10/2013 0516   BUN 4 (L) 11/10/2013 0516   CREATININE 0.77 11/10/2013 0516   CALCIUM 8.8 11/10/2013 0516   GFRNONAA >60 11/10/2013 0516   GFRAA >60 11/10/2013 0516   No results found for: "HGBA1C" No results found for: "INSULIN" CBC    Component Value Date/Time   WBC 12.0 (H) 11/10/2013 0516   RBC 4.21 11/10/2013 0516   HGB 12.7 11/10/2013 0516   HCT 38.4 11/10/2013 0516   PLT 362 11/10/2013 0516   MCV  91 11/10/2013 0516   MCH 30.1 11/10/2013 0516   MCHC 33.0 11/10/2013 0516   RDW 13.7 11/10/2013 0516   Iron/TIBC/Ferritin/ %Sat No results found for: "IRON", "TIBC", "FERRITIN", "IRONPCTSAT" Lipid Panel  No results found for: "CHOL", "TRIG", "HDL", "CHOLHDL", "VLDL", "LDLCALC", "LDLDIRECT" Hepatic Function Panel     Component Value Date/Time   PROT 7.5 10/25/2012 2018   ALBUMIN 3.8 11/10/2013 0516   AST 50 (H) 10/25/2012 2018   ALT 35 10/25/2012 2018   ALKPHOS 58  10/25/2012 2018   BILITOT 0.3 10/25/2012 2018   No results found for: "TSH"   Assessment and Plan:   Healthcare maintenance  Vitamin D deficiency  Primary hypertension  Obesity (BMI 30-39.9), STARTING BMI 34.3    Assessment and Plan         ESTABLISH WITH [facility]    Obesity Treatment / Action Plan:  Patient will work on garnering support from family and friends to begin weight loss journey. Will work on eliminating or reducing the presence of highly palatable, calorie dense foods in the home. Will complete provided nutritional and psychosocial assessment questionnaire before the next appointment. Will be scheduled for indirect calorimetry to determine resting energy expenditure in a fasting state.  This will allow us  to create a reduced calorie, high-protein meal plan to promote loss of fat mass while preserving muscle mass. Counseled on the health benefits of losing 5%-15% of total body weight. Was counseled on nutritional approaches to weight loss and benefits of reducing processed foods and consuming plant-based foods and high quality protein as part of nutritional weight management. Was counseled on pharmacotherapy and role as an adjunct in weight management.   Obesity Education Performed Today:  She was weighed on the bioimpedance scale and results were discussed and documented in the synopsis.  We discussed obesity as a disease and the importance of a more detailed evaluation of all the factors contributing to the disease.  We discussed the importance of long term lifestyle changes which include nutrition, exercise and behavioral modifications as well as the importance of customizing this to her specific health and social needs.  We discussed the benefits of reaching a healthier weight to alleviate the symptoms of existing conditions and reduce the risks of the biomechanical, metabolic and psychological effects of obesity.  Cheryl Murillo appears to be in the action  stage of change and states they are ready to start intensive lifestyle modifications and behavioral modifications.  I have spent 35 minutes in the care of the patient today including: preparing to see patient (e.g. review and interpretation of tests, old notes ), obtaining and/or reviewing separately obtained history, performing a medically appropriate examination or evaluation, counseling and educating the patient, documenting clinical information in the electronic or other health care record, and independently interpreting results and communicating results to the patient, family, or caregiver   Reviewed by clinician on day of visit: allergies, medications, problem list, medical history, surgical history, family history, social history, and previous encounter notes pertinent to obesity diagnosis.  Yesena Reaves d. Callaway Hailes, NP-C

## 2023-11-11 ENCOUNTER — Encounter (INDEPENDENT_AMBULATORY_CARE_PROVIDER_SITE_OTHER): Payer: Self-pay

## 2023-11-24 ENCOUNTER — Encounter (INDEPENDENT_AMBULATORY_CARE_PROVIDER_SITE_OTHER): Payer: Self-pay | Admitting: Family Medicine

## 2023-11-24 ENCOUNTER — Ambulatory Visit (INDEPENDENT_AMBULATORY_CARE_PROVIDER_SITE_OTHER): Admitting: Family Medicine

## 2023-11-24 VITALS — BP 111/76 | HR 68 | Temp 98.1°F | Ht 65.5 in | Wt 216.0 lb

## 2023-11-24 DIAGNOSIS — F5089 Other specified eating disorder: Secondary | ICD-10-CM

## 2023-11-24 DIAGNOSIS — E669 Obesity, unspecified: Secondary | ICD-10-CM

## 2023-11-24 DIAGNOSIS — I10 Essential (primary) hypertension: Secondary | ICD-10-CM | POA: Diagnosis not present

## 2023-11-24 DIAGNOSIS — Z903 Acquired absence of stomach [part of]: Secondary | ICD-10-CM

## 2023-11-24 DIAGNOSIS — R5383 Other fatigue: Secondary | ICD-10-CM

## 2023-11-24 DIAGNOSIS — Z1331 Encounter for screening for depression: Secondary | ICD-10-CM

## 2023-11-24 DIAGNOSIS — E559 Vitamin D deficiency, unspecified: Secondary | ICD-10-CM

## 2023-11-24 DIAGNOSIS — F39 Unspecified mood [affective] disorder: Secondary | ICD-10-CM | POA: Diagnosis not present

## 2023-11-24 DIAGNOSIS — Z6835 Body mass index (BMI) 35.0-35.9, adult: Secondary | ICD-10-CM

## 2023-11-24 DIAGNOSIS — E7849 Other hyperlipidemia: Secondary | ICD-10-CM | POA: Diagnosis not present

## 2023-11-24 DIAGNOSIS — R0602 Shortness of breath: Secondary | ICD-10-CM

## 2023-11-24 NOTE — Progress Notes (Signed)
 Barnie DOROTHA Jenkins, D.O.  ABFM, ABOM Specializing in Clinical Bariatric Medicine Office located at: [address]  [place], [place]  [address]   Bariatric Medicine Visit  Dear Auston, Reyes BIRCH, MD   Thank you for referring LUKE GORMAN Molt to our clinic today for evaluation.  We performed a consultation to discuss her options for treatment and educate the patient on her disease state.  The following note includes my evaluation and treatment recommendations.   Please do not hesitate to reach out to me directly if you have any further concerns.    Assessment and Plan:   Orders Placed This Encounter  Procedures   VITAMIN D  25 Hydroxy (Vit-D Deficiency, Fractures)   TSH   T4, free   Lipid panel   Insulin , random   Hemoglobin A1c   Folate   Comprehensive metabolic panel with GFR   Vitamin B12   CBC with Differential/Platelet   EKG 12-Lead    FOR THE DISEASE OF OBESITY:  BMI 35.0-35.9,adult current 35.40 Starting Obesity (BMI 30-39.9) 35.40 Assessment & Plan: Abbygail is currently in the action stage of change. As such, her goal is to start our weight management plan.  She has agreed to implement the CAT 1 MP with 8 ounces of lean protein at night.    Behavioral Intervention We discussed the following today: continue to work on maintaining a reduced calorie state, getting the recommended amount of protein, incorporating whole foods, making healthy choices, staying well hydrated and practicing mindfulness when eating.  Additional resources provided today: Handout on CAT 1 meal plan   Evidence-based interventions for health behavior change were utilized today including the discussion of self monitoring techniques, problem-solving barriers and SMART goal setting techniques.    Pt will specifically work on measuring her intake of veggies and lean proteins and avoiding skipping meals.    Recommended Physical Activity Goals Maansi has been advised to work up to 150 minutes  of moderate intensity aerobic activity a week and strengthening exercises 2-3 times per week for cardiovascular health, weight loss maintenance and preservation of muscle mass.   She has agreed to : maintain current level of activity.    Pharmacotherapy Continue with current nutritional and behavioral strategies   ASSOCIATED CONDITIONS ADDRESSED TODAY:   Fatigue Assessment & Plan: Jashawna does feel that her weight is causing her energy to be lower than it should be. Fatigue may be related to obesity, depression or many other causes. she does not appear to have any red flag symptoms and this appears to most likely be related to her current lifestyle habits and dietary intake.  Labs will be ordered and reviewed with her at their next office visit in two weeks.  Epworth sleepiness scale is 1 and appears to be within normal limits. Vora admits to occasional daytime somnolence and admits to waking up still tired. Patient sometimes has morning headaches 1 time per week. Sharonica generally gets 8 or 9 hours of sleep per night, and states that she has generally restful sleep. Snoring is not present. Apneic episodes is not present.   ECG: Performed and reviewed/ interpreted independently.  Normal sinus rhythm, rate 61 bpm; reassuring without any acute abnormalities, will continue to monitor for symptoms     Shortness of breath on exertion Assessment & Plan: Aleaha does feel that she gets out of breath more easily than she used to when she exercises and seems to be worsening over time with weight gain.  This has gotten worse  recently. Anthea denies shortness of breath at rest or orthopnea. Pt denies chest pain, dizziness, heart palpitations, or excessive diaphoresis or nausea with activity.  This is not new and is ongoing.  Jewelene's shortness of breath appears to be obesity related and exercise induced, as they do not appear to have any red flag symptoms/ concerns today.  Also, this  condition appears to be related to a state of poor cardiovascular conditioning   Obtain labs today and will be reviewed with her at their next office visit in two weeks.  Indirect Calorimeter completed today to help guide our dietary regimen. It shows a VO2 of 239 and a REE of 1642.  Her calculated basal metabolic rate is 8316 thus her resting energy expenditure is slightly worse than expected.  Patient agreed to work on weight loss at this time.  As Linsie progresses through our weight loss program, we will gradually increase exercise as tolerated to treat her current condition.   If Mikaella follows our recommendations and loses 5-10% of their weight without improvement of her shortness of breath or if at any time, symptoms become more concerning, they agree to urgently follow up with their PCP/ specialist for further consideration/ evaluation.   Talar verbalizes agreement with this plan.     Mood disorder (HCC) - emotional eating/ Depression Screen  Assessment & Plan:    11/24/2023    9:51 AM  PHQ9 SCORE ONLY  PHQ-9 Total Score 4    She has a history of anxiety and depression. Denies any SI/HI. Moods are stable. She has been on Geodon for 30 years and feels it helps particularly with sleep. She sleeps 8-9 hours per night and has generally restful sleep. She has emotional eating tendencies mainly when bored or stressed.   - Continue Geodon at current dose.  - Consider Dr.Barker referral in the future if needed.  - Initiate prudent nutritional plan which can support emotional well-being.       Other hyperlipidemia Assessment & Plan: Managed with diet and life style interventions. Most recent LDL of 138 done 7 months ago.   - Begin to work on Engineer, technical sales -decreasing simple carbohydrates, increasing lean proteins, decreasing saturated fats and cholesterol , and avoiding trans fats as able to promote weight loss, improve lipids and decrease cardiovascular risks.  - Recheck lipid  panel today.     Primary hypertension Assessment & Plan: Last 3 blood pressure readings in our office are as follows: BP Readings from Last 3 Encounters:  11/24/23 111/76  09/25/23 130/83  08/08/23 115/82   History of a-fib. On Metoprolol 1 tablet daily. When she has palpitations, which is not often, she takes the Metoprolol twice daily. BP at goal today. Pt asx. No acute concerns.  - Continue Metoprolol - Begin to work on low sodium nutrition plan to promote weight loss.  - We will begin to monitor closely alongside PCP/ specialists.  Pt reminded to also f/up with those individuals as instructed by them.     S/P gastric sleeve procedure Assessment & Plan: She has a history of gastric sleeve in 2015 done at [facility] by Dr.Tyner. Pre-op weight: 255 lbs. Lowest weight after surgery: 155 lbs (took 3 years to achieve). Kept off 69 lbs to date; started regaining weight after sitting more, eating more, working longer  - Begin implementation of medically supervised weight management plan - Pt encouraged to eat small multiple meals throughout the day to get in all her foods.  Vitamin D  deficiency Assessment & Plan: Per history. She is prescribed Ergocalciferol  50,000 units weekly but acknowledges skipping doses. Will recheck Vit D level today and provide further guidance next OV.     FOLLOW UP:   Follow up in 2 weeks. She was informed of the importance of frequent follow up visits to maximize her success with intensive lifestyle modifications for her multiple health conditions.  RONIN CRAGER is aware that we will review all of her lab results at our next visit.  She is aware that if anything is critical/ life threatening with the results, we will be contacting her via MyChart prior to the office visit to discuss management.     Chief Complaint:   OBESITY JULIEANNA GERACI (MR# [id number]) is a pleasant 53 y.o. female who presents for evaluation and treatment of obesity and related  comorbidities. Current BMI is Body mass index is 35.4 kg/m. RYLEN HOU has been struggling with her weight for many years and has been unsuccessful in either losing weight, maintaining weight loss, or reaching her healthy weight goal.  Suzen GORMAN Molt is currently in the action stage of change and ready to dedicate time achieving and maintaining a healthier weight. CHIANTI GOH is interested in becoming our patient and working on intensive lifestyle modifications including (but not limited to) diet and exercise for weight loss.  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 She has 2 adult children. She lives with her spouse Dorise and their 65 y.o daughter.  Started gaining weight in her late thirties due to bad food choices and stress eating.   Does not engage in formal exercise.   Averages 5,000 steps/day.   Has tried Zepbound but got sick with it. Also tried Weight Watchers (lost 20 lbs, regained weight)  Eats fast food or take out 5 days per week.   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   Craves sweet tea and dark chocolate at night.  Snacks on candy and popcorn.  Skips breakfast every day and sometimes lunch 3x/week.  Husband makes it difficult to eat healthy because he eats a lot of candy.   Drinks several caloric beverages including regular soda 1-2 times per week, milk, juice, tea with sugar, smoothies, fair life protein drinks, and mixed alcoholic drinks 1-2 times/month.  Worst food habits: rice, potatoes, pasta.    Subjective:   This is the patient's first visit at [facility].  The patient's NEW PATIENT PACKET that they filled out prior to today's office visit was reviewed at length and information from that paperwork was included within the following office visit note.    Included in the packet: current and past health history, medications, allergies, ROS, gynecologic  history (women only), surgical history, family history, social history, weight history, weight loss surgery history (for those that have had weight loss surgery), nutritional evaluation, mood and food questionnaire along with a depression screening (PHQ9) on all patients, an Epworth questionnaire, sleep habits questionnaire, patient life and health improvement goals questionnaire. These will all be scanned into the patient's chart under the media tab.   Review of Systems: Please refer to new patient packet scanned into media. Pertinent positives were addressed with patient today.  Reviewed by clinician on day of visit: allergies, medications, problem list, medical history, surgical history, family history, social history, and previous encounter notes.  During the visit, I independently reviewed the patient's EKG, bioimpedance scale  results, and indirect calorimeter results. I used this information to tailor a meal plan for the patient that will help Suzen GORMAN Molt to lose weight and will improve her obesity-related conditions going forward.  I performed a medically necessary appropriate examination and/or evaluation. I discussed the assessment and treatment plan with the patient. The patient was provided an opportunity to ask questions and all were answered. The patient agreed with the plan and demonstrated an understanding of the instructions. Labs were ordered today (unless patient declined them) and will be reviewed with the patient at our next visit unless more critical results need to be addressed immediately. Clinical information was updated and documented in the EMR.    Objective:   PHYSICAL EXAM: Blood pressure 111/76, pulse 68, temperature 98.1 F (36.7 C), height 5' 5.5 (1.664 m), weight 216 lb (98 kg), last menstrual period 05/29/2017, SpO2 98%. Body mass index is 35.4 kg/m.  General: Well Developed, well nourished, and in no acute distress.  HEENT: Normocephalic, atraumatic; EOMI,  sclerae are anicteric. Skin: Warm and dry, good turgor Chest:  Normal excursion, shape, no gross ABN Respiratory: No conversational dyspnea; speaking in full sentences NeuroM-Sk:  Normal gross ROM * 4 extremities  Psych: A and O *3, insight adequate, mood- full   Anthropometric Measurements Height: 5' 5.5 (1.664 m) Weight: 216 lb (98 kg) BMI (Calculated): 35.38 Weight at Last Visit: 0lb Weight Lost Since Last Visit: 0lb Weight Gained Since Last Visit: 0lb Starting Weight: 216lb Total Weight Loss (lbs): 0 lb (0 kg) Peak Weight: 279lb Waist Measurement : 40 inches   Body Composition  Body Fat %: 44.3 % Fat Mass (lbs): 95.8 lbs Muscle Mass (lbs): 114.6 lbs Total Body Water (lbs): 83.2 lbs Visceral Fat Rating : 12   Other Clinical Data RMR: 1642 Fasting: Yes Labs: Yes Today's Visit #: 1 Starting Date: 11/24/23 Comments: First  Visit    DIAGNOSTIC DATA REVIEWED:  BMET    Component Value Date/Time   NA 137 11/10/2013 0516   K 4.3 11/10/2013 0516   CL 103 11/10/2013 0516   CO2 29 11/10/2013 0516   GLUCOSE 150 (H) 11/10/2013 0516   BUN 4 (L) 11/10/2013 0516   CREATININE 0.77 11/10/2013 0516   CALCIUM 8.8 11/10/2013 0516   GFRNONAA >60 11/10/2013 0516   GFRAA >60 11/10/2013 0516   No results found for: HGBA1C No results found for: INSULIN  No results found for: TSH CBC    Component Value Date/Time   WBC 12.0 (H) 11/10/2013 0516   RBC 4.21 11/10/2013 0516   HGB 12.7 11/10/2013 0516   HCT 38.4 11/10/2013 0516   PLT 362 11/10/2013 0516   MCV 91 11/10/2013 0516   MCH 30.1 11/10/2013 0516   MCHC 33.0 11/10/2013 0516   RDW 13.7 11/10/2013 0516   Iron Studies No results found for: IRON, TIBC, FERRITIN, IRONPCTSAT Lipid Panel  No results found for: CHOL, TRIG, HDL, CHOLHDL, VLDL, LDLCALC, LDLDIRECT Hepatic Function Panel     Component Value Date/Time   PROT 7.5 10/25/2012 2018   ALBUMIN 3.8 11/10/2013 0516   AST 50 (H)  10/25/2012 2018   ALT 35 10/25/2012 2018   ALKPHOS 58 10/25/2012 2018   BILITOT 0.3 10/25/2012 2018   No results found for: TSH Nutritional No results found for: VD25OH  Attestation Statements:   I, Special Puri, acting as a Stage manager for [employer], DO., have compiled all relevant documentation for today's office visit on behalf of Barnie Jenkins, DO, while in the  presence of [employer], DO.  I have spent 50 minutes in the care of the patient today including 40 minutes face-to-face assessing and reviewing listed medical problems above as outlined in office visit note, providing nutritional and behavioral counseling as outlined in obesity care plan, independently interpreting results and goals of care, see listed medical problems, and discussing biometric information and progress.   I have reviewed the above documentation for accuracy and completeness, and I agree with the above. Barnie JINNY Jenkins, D.O.  The 21st Century Cures Act was signed into law in 2016 which includes the topic of electronic health records.  This provides immediate access to information in MyChart.  This includes consultation notes, operative notes, office notes, lab results and pathology reports.  If you have any questions about what you read please let us  know at your next visit so we can discuss your concerns and take corrective action if need be.  We are right here with you.

## 2023-11-25 LAB — HEMOGLOBIN A1C
Est. average glucose Bld gHb Est-mCnc: 100 mg/dL
Hgb A1c MFr Bld: 5.1 % (ref 4.8–5.6)

## 2023-11-25 LAB — FOLATE: Folate: 6.7 ng/mL (ref >3.0)

## 2023-11-25 LAB — CBC WITH DIFFERENTIAL/PLATELET
Basophils Absolute: 0.1 x10E3/uL (ref 0.0–0.2)
Basos: 1 %
EOS (ABSOLUTE): 0.1 x10E3/uL (ref 0.0–0.4)
Eos: 2 %
Hematocrit: 39.7 % (ref 34.0–46.6)
Hemoglobin: 13.2 g/dL (ref 11.1–15.9)
Immature Grans (Abs): 0 x10E3/uL (ref 0.0–0.1)
Immature Granulocytes: 0 %
Lymphocytes Absolute: 2.1 x10E3/uL (ref 0.7–3.1)
Lymphs: 44 %
MCH: 31.9 pg (ref 26.6–33.0)
MCHC: 33.2 g/dL (ref 31.5–35.7)
MCV: 96 fL (ref 79–97)
Monocytes Absolute: 0.4 x10E3/uL (ref 0.1–0.9)
Monocytes: 8 %
Neutrophils Absolute: 2.1 x10E3/uL (ref 1.4–7.0)
Neutrophils: 45 %
Platelets: 265 x10E3/uL (ref 150–450)
RBC: 4.14 x10E6/uL (ref 3.77–5.28)
RDW: 12.6 % (ref 11.7–15.4)
WBC: 4.8 x10E3/uL (ref 3.4–10.8)

## 2023-11-25 LAB — TSH: TSH: 1.17 u[IU]/mL (ref 0.450–4.500)

## 2023-11-25 LAB — VITAMIN B12: Vitamin B-12: 257 pg/mL (ref 232–1245)

## 2023-11-25 LAB — COMPREHENSIVE METABOLIC PANEL WITH GFR
ALT: 17 IU/L (ref 0–32)
AST: 21 IU/L (ref 0–40)
Albumin: 4.5 g/dL (ref 3.8–4.9)
Alkaline Phosphatase: 71 IU/L (ref 44–121)
BUN/Creatinine Ratio: 13 (ref 9–23)
BUN: 11 mg/dL (ref 6–24)
Bilirubin Total: 0.5 mg/dL (ref 0.0–1.2)
CO2: 22 mmol/L (ref 20–29)
Calcium: 9 mg/dL (ref 8.7–10.2)
Chloride: 102 mmol/L (ref 96–106)
Creatinine, Ser: 0.84 mg/dL (ref 0.57–1.00)
Globulin, Total: 2.5 g/dL (ref 1.5–4.5)
Glucose: 91 mg/dL (ref 70–99)
Potassium: 4.2 mmol/L (ref 3.5–5.2)
Sodium: 141 mmol/L (ref 134–144)
Total Protein: 7 g/dL (ref 6.0–8.5)
eGFR: 83 mL/min/1.73 (ref >59)

## 2023-11-25 LAB — LIPID PANEL
Chol/HDL Ratio: 4.1 ratio (ref 0.0–4.4)
Cholesterol, Total: 211 mg/dL — ABNORMAL HIGH (ref 100–199)
HDL: 52 mg/dL (ref >39)
LDL Chol Calc (NIH): 137 mg/dL — ABNORMAL HIGH (ref 0–99)
Triglycerides: 123 mg/dL (ref 0–149)
VLDL Cholesterol Cal: 22 mg/dL (ref 5–40)

## 2023-11-25 LAB — T4, FREE: Free T4: 1.07 ng/dL (ref 0.82–1.77)

## 2023-11-25 LAB — VITAMIN D 25 HYDROXY (VIT D DEFICIENCY, FRACTURES): Vit D, 25-Hydroxy: 71.2 ng/mL (ref 30.0–100.0)

## 2023-11-25 LAB — INSULIN, RANDOM: INSULIN: 9.7 u[IU]/mL (ref 2.6–24.9)

## 2023-12-04 ENCOUNTER — Ambulatory Visit (INDEPENDENT_AMBULATORY_CARE_PROVIDER_SITE_OTHER): Admitting: Family Medicine

## 2023-12-08 ENCOUNTER — Ambulatory Visit (INDEPENDENT_AMBULATORY_CARE_PROVIDER_SITE_OTHER): Admitting: Family Medicine

## 2023-12-22 ENCOUNTER — Ambulatory Visit (INDEPENDENT_AMBULATORY_CARE_PROVIDER_SITE_OTHER): Admitting: Adult Health

## 2023-12-28 ENCOUNTER — Other Ambulatory Visit: Payer: Self-pay | Admitting: Physician Assistant

## 2023-12-28 DIAGNOSIS — N390 Urinary tract infection, site not specified: Secondary | ICD-10-CM

## 2024-01-01 ENCOUNTER — Encounter (INDEPENDENT_AMBULATORY_CARE_PROVIDER_SITE_OTHER): Payer: Self-pay

## 2024-01-01 ENCOUNTER — Ambulatory Visit (INDEPENDENT_AMBULATORY_CARE_PROVIDER_SITE_OTHER): Admitting: Family Medicine

## 2024-01-01 DIAGNOSIS — Z91199 Patient's noncompliance with other medical treatment and regimen due to unspecified reason: Secondary | ICD-10-CM

## 2024-01-01 NOTE — Progress Notes (Signed)
 Did not show for appt.

## 2024-06-08 ENCOUNTER — Encounter (INDEPENDENT_AMBULATORY_CARE_PROVIDER_SITE_OTHER): Admitting: Vascular Surgery

## 2024-06-18 ENCOUNTER — Other Ambulatory Visit: Payer: Self-pay | Admitting: Physician Assistant

## 2024-06-18 DIAGNOSIS — N390 Urinary tract infection, site not specified: Secondary | ICD-10-CM
